# Patient Record
Sex: Male | Born: 1990 | Race: Black or African American | Hispanic: No | Marital: Single | Smoking: Never smoker
Health system: Southern US, Community
[De-identification: ages and names within clinical notes are randomized; demographics above are authoritative.]

## PROBLEM LIST (undated history)

## (undated) DIAGNOSIS — J309 Allergic rhinitis, unspecified: Secondary | ICD-10-CM

## (undated) DIAGNOSIS — G8929 Other chronic pain: Secondary | ICD-10-CM

## (undated) DIAGNOSIS — R519 Headache, unspecified: Secondary | ICD-10-CM

## (undated) DIAGNOSIS — R51 Headache: Secondary | ICD-10-CM

---
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
## 2012-07-13 ENCOUNTER — Encounter (HOSPITAL_COMMUNITY): Payer: Self-pay | Admitting: *Deleted

## 2012-07-13 ENCOUNTER — Emergency Department (HOSPITAL_COMMUNITY)
Admission: EM | Admit: 2012-07-13 | Discharge: 2012-07-14 | Disposition: A | Discharge status: Home / Self Care | Payer: BC Managed Care – PPO | Attending: Emergency Medicine | Admitting: Emergency Medicine

## 2012-07-13 DIAGNOSIS — R112 Nausea with vomiting, unspecified: Secondary | ICD-10-CM | POA: Insufficient documentation

## 2012-07-13 DIAGNOSIS — R42 Dizziness and giddiness: Secondary | ICD-10-CM | POA: Insufficient documentation

## 2012-07-13 DIAGNOSIS — R51 Headache: Secondary | ICD-10-CM | POA: Insufficient documentation

## 2012-07-13 MED ORDER — MORPHINE SULFATE 4 MG/ML IJ SOLN
4.0000 mg | Freq: Once | INTRAMUSCULAR | Status: AC
  Administered 2012-07-14: 4 mg via INTRAVENOUS
  Filled 2012-07-13: qty 1

## 2012-07-13 MED ORDER — PROMETHAZINE HCL 25 MG/ML IJ SOLN
12.5000 mg | Freq: Once | INTRAMUSCULAR | Status: AC
  Administered 2012-07-14: 12.5 mg via INTRAVENOUS
  Filled 2012-07-13: qty 1

## 2012-07-13 MED ORDER — SODIUM CHLORIDE 0.9 % IV SOLN: 1000.0000 mL | INTRAVENOUS | Status: DC

## 2012-07-13 MED ORDER — SODIUM CHLORIDE 0.9 % IV SOLN
1000.0000 mL | Freq: Once | INTRAVENOUS | Status: AC
  Administered 2012-07-14: 1000 mL via INTRAVENOUS

## 2012-07-13 NOTE — ED Notes (Signed)
Pt c/o vomiting x 2 days, dizziness earlier with a headache.

## 2012-07-14 LAB — URINALYSIS, ROUTINE W REFLEX MICROSCOPIC
Bilirubin Urine: NEGATIVE
Glucose, UA: NEGATIVE mg/dL
Hgb urine dipstick: NEGATIVE
Ketones, ur: 15 mg/dL — AB
Leukocytes, UA: NEGATIVE
Nitrite: NEGATIVE
Protein, ur: NEGATIVE mg/dL
Specific Gravity, Urine: 1.025 (ref 1.005–1.030)
Urobilinogen, UA: 0.2 mg/dL (ref 0.0–1.0)
pH: 6 (ref 5.0–8.0)

## 2012-07-14 LAB — BASIC METABOLIC PANEL
CO2: 27 mEq/L (ref 19–32)
Glucose, Bld: 87 mg/dL (ref 70–99)
Potassium: 3.5 mEq/L (ref 3.5–5.1)
Sodium: 136 mEq/L (ref 135–145)

## 2012-07-14 MED ORDER — PROMETHAZINE HCL 12.5 MG PO TABS: 12.5000 mg | ORAL_TABLET | Freq: Four times a day (QID) | ORAL | Status: DC | PRN

## 2012-07-14 MED ORDER — HYDROCODONE-ACETAMINOPHEN 5-325 MG PO TABS: 1.0000 | ORAL_TABLET | ORAL | Status: DC | PRN

## 2012-07-14 NOTE — ED Provider Notes (Signed)
History    CSN: [id number] Arrival date & time 07/13/12  2242  First MD Initiated Contact with Patient 07/13/12 2336     Chief Complaint  Patient presents with  . Emesis  . Headache  . Dizziness   (Consider location/radiation/quality/duration/timing/severity/associated sxs/prior Treatment) Patient is a 22 y.o. male presenting with vomiting and headaches. The history is provided by the patient.  Emesis Severity:  Moderate Duration:  2 days Timing:  Intermittent Quality:  Stomach contents Able to tolerate:  Liquids Progression:  Worsening Chronicity:  New Recent urination:  Normal Relieved by:  Nothing Worsened by:  Nothing tried Ineffective treatments:  None tried Associated symptoms: headaches   Associated symptoms: no abdominal pain, no arthralgias, no fever and no sore throat   Risk factors: no alcohol use, no diabetes and no suspect food intake   Headache Associated symptoms: vomiting   Associated symptoms: no abdominal pain, no back pain, no cough, no dizziness, no neck pain, no photophobia, no seizures and no sore throat    History reviewed. No pertinent past medical history. History reviewed. No pertinent past surgical history. History reviewed. No pertinent family history. History  Substance Use Topics  . Smoking status: Never Smoker   . Smokeless tobacco: Not on file  . Alcohol Use: No    Review of Systems  Constitutional: Negative for activity change.       All ROS Neg except as noted in HPI  HENT: Negative for nosebleeds, sore throat and neck pain.   Eyes: Negative for photophobia and discharge.  Respiratory: Negative for cough, shortness of breath and wheezing.   Cardiovascular: Negative for chest pain and palpitations.  Gastrointestinal: Positive for vomiting. Negative for abdominal pain and blood in stool.  Genitourinary: Negative for dysuria, frequency and hematuria.  Musculoskeletal: Negative for back pain and arthralgias.  Skin: Negative.    Neurological: Positive for headaches. Negative for dizziness, seizures and speech difficulty.  Psychiatric/Behavioral: Negative for hallucinations and confusion.    Allergies  Other and Propylene carbonate  Home Medications  No current outpatient prescriptions on file. BP 147/77  Pulse 63  Temp(Src) 98.6 F (37 C) (Oral)  Resp 18  Ht 5\' 2"  (1.575 m)  Wt 180 lb (81.647 kg)  BMI 32.91 kg/m2 Physical Exam  Nursing note and vitals reviewed. Constitutional: He is oriented to person, place, and time. He appears well-developed and well-nourished.  Non-toxic appearance.  HENT:  Head: Normocephalic.  Right Ear: Tympanic membrane and external ear normal.  Left Ear: Tympanic membrane and external ear normal.  Eyes: EOM and lids are normal. Pupils are equal, round, and reactive to light.  Neck: Normal range of motion. Neck supple. Carotid bruit is not present.  Cardiovascular: Normal rate, regular rhythm, normal heart sounds, intact distal pulses and normal pulses.   Pulmonary/Chest: Breath sounds normal. No respiratory distress.  Abdominal: Soft. Bowel sounds are normal. There is no tenderness. There is no guarding.  Musculoskeletal: Normal range of motion.  Lymphadenopathy:       Head (right side): No submandibular adenopathy present.       Head (left side): No submandibular adenopathy present.    He has no cervical adenopathy.  Neurological: He is alert and oriented to person, place, and time. He has normal strength. No cranial nerve deficit or sensory deficit. He exhibits normal muscle tone. Coordination normal.  Skin: Skin is warm and dry.  Psychiatric: He has a normal mood and affect. His speech is normal.    ED Course  Procedures (including critical care time) Labs Reviewed  URINALYSIS, ROUTINE W REFLEX MICROSCOPIC - Abnormal; Notable for the following:    Ketones, ur 15 (*)    All other components within normal limits  BASIC METABOLIC PANEL   No results found. No  diagnosis found.  MDM  I have reviewed nursing notes, vital signs, and all appropriate lab and imaging results for this patient. Patient presents to the emergency department with 2-3 days of vomiting. Today the patient experienced some dizziness accompanied by headache mostly in the frontal region. Patient did not measure an elevation in temperature.  Vital signs are well within normal limits. Examination is negative for any acute findings or changes.  Patient given IV fluids and IV antibiotic with good results. Recheck of the patient reveals the headache has resolved. There's been no nausea or vomiting since being in the emergency department. The patient feels at this time he can handle the condition at home with anti-medics prescriptions.  Patient advised to return to the emergency department immediately if any changes, problems, or concerns.  Kathie Dike, PA-C 07/14/12 [phone]

## 2012-07-24 NOTE — ED Provider Notes (Signed)
Medical screening examination/treatment/procedure(s) were performed by non-physician practitioner and as supervising physician I was immediately available for consultation/collaboration.  Farha Dano S. Etheline Geppert, MD 07/24/12 0441 

## 2013-02-02 ENCOUNTER — Encounter (HOSPITAL_COMMUNITY): Payer: Self-pay | Admitting: Emergency Medicine

## 2013-02-02 ENCOUNTER — Emergency Department (HOSPITAL_COMMUNITY)
Admission: EM | Admit: 2013-02-02 | Discharge: 2013-02-02 | Disposition: A | Discharge status: Home / Self Care | Payer: BC Managed Care – PPO | Attending: Emergency Medicine | Admitting: Emergency Medicine

## 2013-02-02 ENCOUNTER — Emergency Department (HOSPITAL_COMMUNITY): Payer: BC Managed Care – PPO

## 2013-02-02 DIAGNOSIS — G8929 Other chronic pain: Secondary | ICD-10-CM | POA: Insufficient documentation

## 2013-02-02 DIAGNOSIS — R195 Other fecal abnormalities: Secondary | ICD-10-CM

## 2013-02-02 DIAGNOSIS — J069 Acute upper respiratory infection, unspecified: Secondary | ICD-10-CM | POA: Insufficient documentation

## 2013-02-02 DIAGNOSIS — R112 Nausea with vomiting, unspecified: Secondary | ICD-10-CM | POA: Insufficient documentation

## 2013-02-02 DIAGNOSIS — K921 Melena: Secondary | ICD-10-CM | POA: Insufficient documentation

## 2013-02-02 HISTORY — DX: Headache, unspecified: G89.29

## 2013-02-02 HISTORY — DX: Allergic rhinitis, unspecified: J30.9

## 2013-02-02 HISTORY — DX: Headache: R51

## 2013-02-02 HISTORY — DX: Headache, unspecified: R51.9

## 2013-02-02 LAB — COMPREHENSIVE METABOLIC PANEL
ALBUMIN: 3.8 g/dL (ref 3.5–5.2)
ALT: 57 U/L — AB (ref 0–53)
AST: 44 U/L — AB (ref 0–37)
Alkaline Phosphatase: 71 U/L (ref 39–117)
BILIRUBIN TOTAL: 0.3 mg/dL (ref 0.3–1.2)
BUN: 10 mg/dL (ref 6–23)
CHLORIDE: 99 meq/L (ref 96–112)
CO2: 28 meq/L (ref 19–32)
CREATININE: 1.05 mg/dL (ref 0.50–1.35)
Calcium: 9 mg/dL (ref 8.4–10.5)
GFR calc Af Amer: >90 mL/min (ref >90)
GFR calc non Af Amer: >90 mL/min (ref >90)
Glucose, Bld: 94 mg/dL (ref 70–99)
Potassium: 3.8 mEq/L (ref 3.7–5.3)
SODIUM: 137 meq/L (ref 137–147)
Total Protein: 7.5 g/dL (ref 6.0–8.3)

## 2013-02-02 LAB — CBC WITH DIFFERENTIAL/PLATELET
BASOS ABS: 0 10*3/uL (ref 0.0–0.1)
BASOS PCT: 0 % (ref 0–1)
Eosinophils Absolute: 0.2 10*3/uL (ref 0.0–0.7)
Eosinophils Relative: 5 % (ref 0–5)
HEMATOCRIT: 41.8 % (ref 39.0–52.0)
Hemoglobin: 14.6 g/dL (ref 13.0–17.0)
LYMPHS PCT: 22 % (ref 12–46)
Lymphs Abs: 1.1 10*3/uL (ref 0.7–4.0)
MCH: 30.2 pg (ref 26.0–34.0)
MCHC: 34.9 g/dL (ref 30.0–36.0)
MCV: 86.5 fL (ref 78.0–100.0)
MONO ABS: 0.4 10*3/uL (ref 0.1–1.0)
Monocytes Relative: 9 % (ref 3–12)
NEUTROS ABS: 3 10*3/uL (ref 1.7–7.7)
NEUTROS PCT: 63 % (ref 43–77)
PLATELETS: 164 10*3/uL (ref 150–400)
RBC: 4.83 MIL/uL (ref 4.22–5.81)
RDW: 12.8 % (ref 11.5–15.5)
WBC: 4.7 10*3/uL (ref 4.0–10.5)

## 2013-02-02 LAB — RAPID STREP SCREEN (MED CTR MEBANE ONLY): STREPTOCOCCUS, GROUP A SCREEN (DIRECT): NEGATIVE

## 2013-02-02 LAB — LIPASE, BLOOD: Lipase: 29 U/L (ref 11–59)

## 2013-02-02 MED ORDER — BENZONATATE 100 MG PO CAPS: 100.0000 mg | ORAL_CAPSULE | Freq: Three times a day (TID) | ORAL | Status: DC | PRN

## 2013-02-02 MED ORDER — ONDANSETRON HCL 4 MG PO TABS: 4.0000 mg | ORAL_TABLET | Freq: Three times a day (TID) | ORAL | Status: DC | PRN

## 2013-02-02 MED ORDER — FAMOTIDINE 20 MG PO TABS
40.0000 mg | ORAL_TABLET | Freq: Once | ORAL | Status: AC
  Administered 2013-02-02: 40 mg via ORAL
  Filled 2013-02-02: qty 2

## 2013-02-02 NOTE — ED Notes (Signed)
Pt now reporting lower abd pain and headache rating 4/10 pain.

## 2013-02-02 NOTE — ED Notes (Signed)
Reports chest congestion and cough x 2 days.  Vomiting blood x 2 today.  Denies abd pain/diarrhea.

## 2013-02-02 NOTE — Discharge Instructions (Signed)
°Emergency Department Resource Guide °1) Find a Doctor and Pay Out of Pocket °Although you won't have to find out who is covered by your insurance plan, it is a good idea to ask around and get recommendations. You will then need to call the office and see if the doctor you have chosen will accept you as a new patient and what types of options they offer for patients who are self-pay. Some doctors offer discounts or will set up payment plans for their patients who do not have insurance, but you will need to ask so you aren't surprised when you get to your appointment. ° °2) Contact Your Local Health Department °Not all health departments have doctors that can see patients for sick visits, but many do, so it is worth a call to see if yours does. If you don't know where your local health department is, you can check in your phone book. The CDC also has a tool to help you locate your state's health department, and many state websites also have listings of all of their local health departments. ° °3) Find a Walk-in Clinic °If your illness is not likely to be very severe or complicated, you may want to try a walk in clinic. These are popping up all over the country in pharmacies, drugstores, and shopping centers. They're usually staffed by nurse practitioners or physician assistants that have been trained to treat common illnesses and complaints. They're usually fairly quick and inexpensive. However, if you have serious medical issues or chronic medical problems, these are probably not your best option. ° °No Primary Care Doctor: °- Call [facility] Connect at  832-8000 - they can help you locate a primary care doctor that  accepts your insurance, provides certain services, etc. °- Physician Referral Service- 1-800-533-3463 ° °Chronic Pain Problems: °Organization         Address  Phone   Notes  °[facility]  (336) 297-2271 Patients need to be referred by their primary care doctor.  ° °Medication  Assistance: °Organization         Address  Phone   Notes  °[place] [facility] [address] °[place], [place] [address] (336) 641-8030 --Must be a resident of [place] °-- Must have NO insurance coverage whatsoever (no Medicaid/ Medicare, etc.) °-- The pt. MUST have a primary care doctor that directs their care regularly and follows them in the community °  °[facility]  (866) 331-1348   °[employer]  (888) 892-1162   ° °Agencies that provide inexpensive medical care: °Organization         Address  Phone   Notes  °[facility]  (336) 832-8035   °[facility]    (336) 832-7272   °[facility] [address] °[place], [place] [address] (336) 832-4777   °[facility] [address], °[place] (336) 271-4999   °[facility]    (336) 373-0678   °[facility]    (336) 272-1050   °[facility] ° [address], [place] Phone:  (336) 832-4444, Fax:  (336) 832-4440 Hours of Operation:  9 am - 6 pm, M-F.  Also accepts Medicaid/Medicare and self-pay.  °[facility] ° [address], [place] Phone: (336) 832-3150, Fax: (336) [phone]. Hours of Operation:  8:30 am - 5:30 pm, M-F.  Also accepts Medicaid and self-pay.  °[facility] [place] [address], [place] Phone: (336) 878-6027   °[facility] [address], [place], [place] (336)723-1848, Ext. 123 Mondays & Thursdays: 7-9 AM.  First 15 patients are seen on a first come, first serve basis. °  ° °Medicaid-accepting [place] Providers: ° °Organization         Address  Phone   Notes  °[facility] [address], [place] (336) 641-2100 Also accepts self-pay patients.  °[facility] [address], [place] ° (336) 856-9996   °[facility] [address], [place]  (336) 288-8857   °[facility] [address] [place] [address], [place] (336) 299-7000   °Veita Bland [address], [place]  ° (336) 373-1557 Only accepts [place] Access Medicaid patients after they have their name applied to their card.  ° °Self-Pay (no insurance) in [place]: ° °Organization         Address  Phone   Notes  °Sickle Cell Patients, [facility] [address], [place] (336) 832-1970   °[facility] [address], [place] (336) 832-4400   °[facility] ° [address], [place] (336) 992-4800   °[facility]/Dr. Osei-Bonsu ° [address] [place] [address], [place] or [address], [place] (336) 841-8500 Phone number for both [place] and [place] locations is the same.  °[facility] [address], [place] (336) 299-0000   °[facility] [address] [place] [address], [place] or [address] (336) 852-7530 °(336) 878-2260   °[facility] [address], [place] (336) 350-1642, phone; (336) 294-5005, fax Sees patients 1st and 3rd Saturday of every month.  Must not qualify for public or private insurance (i.e. Medicaid, Medicare, [place] Health Choice, Veterans' Benefits) • Household income should be no more than 200% of the poverty level •The clinic cannot treat you if you are pregnant or think you are pregnant • Sexually transmitted diseases are not treated at the clinic.  ° ° °Dental Care: °Organization         Address  Phone  Notes  °[place] [facility] [address], [place] (336) 641-6152 Accepts children up to age 21 who are enrolled in Medicaid or [place] Health Choice; pregnant women with a Medicaid card; and children who have applied for Medicaid or [place] Health Choice, but were declined, whose parents can pay a reduced fee at time of service.  °[place]  [facility] [place]  [address], [place] (336) 641-7733 Accepts children up to age 21 who are enrolled in Medicaid or [place] Health Choice; pregnant women with a Medicaid card; and children who have applied for Medicaid or [place] Health Choice, but were declined, whose parents can pay a reduced fee at time of service.  °[facility] ° [address], [place] (336) 641-4533 Patients are seen by appointment only. Walk-ins are not accepted. [facility] will see patients 18 years of age and older. °Monday - Tuesday (8am-5pm) °Most Wednesdays (8:30-5pm) °$30 per visit, cash only  °[facility] ° [address], [place] (336) 641-4533 Patients are seen by appointment only. Walk-ins are not accepted. [facility] will see patients 18 years of age and older. °One   Wednesday Evening (Monthly: Volunteer Based).  $30 per visit, cash only  °[facility]  (919) 537-3737 for adults; Children under age 4, call Graduate Pediatric Dentistry at ([phone]) [phone]. Children aged 4-14, please call (919) [phone] to request a pediatric application. ° Dental services are provided in all areas of dental care including fillings, crowns and bridges, complete and partial dentures, implants, gum treatment, root canals, and extractions. Preventive care is also provided. Treatment is provided to both adults and children. °Patients are selected via a lottery and there is often a waiting list. °  °[facility] [address], °[place] ° (336) 763-8833 [email or web address] °  °[facility] [address], [place], [place] (336)723-1848, Ext. 123 Second and Fourth Thursday of each month, opens at 6:30 AM; Clinic ends at 9 AM.  Patients are seen on a first-come first-served basis, and a limited number are seen during each clinic.  ° °[facility] ° [address], [place], [place] (336) 723-7904    Eligibility Requirements °You must have lived in [place], [place], or [place] counties for at least the last three months. °  You cannot be eligible for state or federal sponsored healthcare insurance, including Veterans Administration, Medicaid, or Medicare. °  You generally cannot be eligible for healthcare insurance through your employer.  °  How to apply: °Eligibility screenings are held every Tuesday and Wednesday afternoon from 1:00 pm until 4:00 pm. You do not need an appointment for the interview!  °[facility] [address], [place], [place] 336-631-2330   °[facility]  336-342-8273   °[facility]  336-703-3100   °[facility]  336-570-6415   ° °Behavioral Health Resources in the Community: °Intensive Outpatient Programs °Organization         Address  Phone  Notes  °[facility] [address], [place], [place] 336-878-6098   °[facility] [address], [place], [place] 336-832-9800   °[facility] [address], [place], [place] ° 336-882-2125   °[facility] [address],  °[place], [place] 1-800-853-5163 or 336-641-4981   °Substance Abuse Resources °Organization         Address  Phone  Notes  °[facility]  336-882-2125   °[facility]  336-784-9470   °[facility]  336-285-9073   °[facility]  336-845-3988   °Residential & Outpatient Substance Abuse Program  1-800-659-3381   °Psychological Services °Organization         Address  Phone  Notes  °[facility]  336- 832-9600   °[facility]  336- 378-7881   °[facility] [address], [place] 1-800-853-5163 or 336-641-4981   ° °Mobile Crisis Teams °Organization         Address  Phone  Notes  °[facility], Mobile Crisis Care Unit  1-877-626-1772   °Assertive °[facility] ° [address]  [place], [place] 336-834-9664   °Sharon DeEsch [address] °[place] 336-554-5454   ° °Self-Help/Support Groups °Organization         Address  Phone             Notes  °[facility] of  - variety of support groups  336- 373-1402 Call for more information  °Narcotics Anonymous (NA), [facility] [address], °[place]  2 meetings at this location  ° °  Residential Treatment Programs Organization         Address  Phone  Notes  [facility] [address],    [place]  [phone]-(563)611-9099   [facility]  [address], [place] [id number], [place], [place] [phone]   [facility] [address], [place] [phone]) [phone] Admissions: 8am-3pm M-F  [facility] [address]    [place], [place] [phone]   [facility] [address], [place], [place] [phone]   [facility] [address]  [place], [place] [phone]   [facility] Intensive Outpatient [address], [place], [place] [phone](804) [phone]   [facility] ([facility] [address]  [place], [place] [phone] or [phone])[phone]   [facility] ([facility] [address] [place], [place] [phone] Accepts Medicaid  [facility] [address]  [place] [phone] Substance Abuse/Addiction Treatment   [facility] Resources Organization         Address  Phone  Notes  [facility]  320-853-0053(888) 843-360-3870   Angie FavaJulie Brannon, PhD [address], [address] [place], [place]   ([phone](336) (351) [phone] or [phone](336) ([phone]   [facility]   [address] [place], [place] [phone](336) [phone]   [facility] [address], [place], [place] (570) [phone](336) (303) [phone] Insurance/Medicaid/sponsorship through [facility] [address]                                    [place], [place] ([phone](336) ([phone]) [phone] Therapy/tele-psych/case    [facility] [address]   [place], [place] [phone](336) [phone]    Dr. Lolly MustacheArfeen  [phone](336) [phone]   [facility]  [employer] [facility] 1) [address], [place] 2) [address], [place] 3)  [address], [place] [phone](336) [phone](336) [phone]  [phone](336) [phone]   [facility] Child Abuse Hotline (581)167-8397(336) 918-674-8663 or 240-710-9758(336) [phone] (After Hours)       Take over the counter decongestant (such as sudafed), as directed on packaging, for the next week.  Use over the counter normal saline nasal spray, as instructed in the Emergency Department, several times per day for the next 2 weeks.  Gargle with warm water several times per day to help with discomfort.  May also use over the counter sore throat pain medicines such as chloraseptic or sucrets, as directed on packaging, as needed for discomfort. Eat a bland diet, avoiding greasy, fatty, fried foods, as well as spicy and acidic foods or beverages.  Avoid eating within the hour or 2 before going to bed or laying down.  Also avoid teas, colas, coffee, chocolate, pepermint and spearment. Avoid ibuprofen and aspirin.  Take over the counter pepcid, one tablet by mouth twice a day, for the next 2 to 3 weeks. Take the prescription as directed. Your liver function tests were mildly elevated today. Call your regular medical doctor tomorrow to schedule a follow up appointment in the next 2 days to recheck this finding. Call the GI doctor tomorrow to schedule a follow up appointment within the next week.  Return to the Emergency Department immediately if worsening.

## 2013-02-02 NOTE — ED Provider Notes (Signed)
CSN: [id number]     Arrival date & time 02/02/13  1640 History   First MD Initiated Contact with Patient 02/02/13 1715     Chief Complaint  Patient presents with  . Hematemesis  . Nasal Congestion  . Sore Throat  . Cough    HPI Pt was seen at 1740.  Per pt, c/o gradual onset and persistence of constant sore throat, runny/stuffy nose, sinus congestion, and cough for the past 2-3 days.  States he coughs until he vomits. States he noticed "red streaks" in his emesis for the past 2 days. Denies N/V without coughing and has been able to tol PO well.  Denies seeing blood in sputum. Denies fevers, no rash, no CP/SOB, no diarrhea, no abd pain, no black or blood in stools.     Past Medical History  Diagnosis Date  . Chronic headache   . Allergic rhinitis    History reviewed. No pertinent past surgical history.  History  Substance Use Topics  . Smoking status: Never Smoker   . Smokeless tobacco: Not on file  . Alcohol Use: No    Review of Systems ROS: Statement: All systems negative except as marked or noted in the HPI; Constitutional: Negative for fever and chills. ; ; Eyes: Negative for eye pain, redness and discharge. ; ; ENMT: Negative for ear pain, hoarseness, +rhinorrhea, nasal congestion, sinus pressure and sore throat. ; ; Cardiovascular: Negative for chest pain, palpitations, diaphoresis, dyspnea and peripheral edema. ; ; Respiratory: +cough. Negative for wheezing and stridor. ; ; Gastrointestinal: +post tussive emesis with red streaks in emesis. Negative for diarrhea, abdominal pain, blood in stool, jaundice and rectal bleeding. . ; ; Genitourinary: Negative for dysuria, flank pain and hematuria. ; ; Musculoskeletal: Negative for back pain and neck pain. Negative for swelling and trauma.; ; Skin: Negative for pruritus, rash, abrasions, blisters, bruising and skin lesion.; ; Neuro: Negative for headache, lightheadedness and neck stiffness. Negative for weakness, altered level of  consciousness , altered mental status, extremity weakness, paresthesias, involuntary movement, seizure and syncope.     Allergies  Other and Propylene carbonate  Home Medications   Current Outpatient Rx  Name  Route  Sig  Dispense  Refill  . pseudoephedrine (SUDAFED) 60 MG tablet   Oral   Take 120 mg by mouth every 4 (four) hours as needed for congestion.          BP 156/84  Pulse 83  Temp(Src) 98.3 F (36.8 C) (Oral)  Resp 16  Ht 5\' 2"  (1.575 m)  Wt 186 lb 7 oz (84.567 kg)  BMI 34.09 kg/m2  SpO2 99% Physical Exam 1745: Physical examination:  Nursing notes reviewed; Vital signs and O2 SAT reviewed;  Constitutional: Well developed, Well nourished, Well hydrated, In no acute distress; Head:  Normocephalic, atraumatic; Eyes: EOMI, PERRL, No scleral icterus; ENMT: TM's clear bilat. +edemetous nasal turbinates bilat with clear rhinorrhea. Mouth and pharynx without lesions. No tonsillar exudates. No intra-oral edema. No submandibular or sublingual edema. No hoarse voice, no drooling, no stridor. No pain with manipulation of larynx. Mouth and pharynx normal, Mucous membranes moist; Neck: Supple, Full range of motion, No lymphadenopathy; Cardiovascular: Regular rate and rhythm, No murmur, rub, or gallop; Respiratory: Breath sounds clear & equal bilaterally, No rales, rhonchi, wheezes.  Speaking full sentences with ease, Normal respiratory effort/excursion; Chest: Nontender, Movement normal; Abdomen: Soft, Nontender, Nondistended, Normal bowel sounds. Rectal exam performed w/permission of pt and ED RN chaperone present.  Anal tone normal.  Non-tender,  soft brown stool in rectal vault, heme positive.  No fissures, no external hemorrhoids, no palp masses.; Genitourinary: No CVA tenderness; Extremities: Pulses normal, No tenderness, No edema, No calf edema or asymmetry.; Neuro: AA&Ox3, Major CN grossly intact.  Speech clear. No gross focal motor or sensory deficits in extremities. Climbs on and off  stretcher easily by himself. Gait steady.; Skin: Color normal, Warm, Dry.   ED Course  Procedures    EKG Interpretation   None       MDM  MDM Reviewed: previous chart, nursing note and vitals Reviewed previous: labs Interpretation: labs and x-ray   Results for orders placed during the hospital encounter of 02/02/13  RAPID STREP SCREEN      Result Value Range   Streptococcus, Group A Screen (Direct) NEGATIVE  NEGATIVE  CBC WITH DIFFERENTIAL      Result Value Range   WBC 4.7  4.0 - 10.5 K/uL   RBC 4.83  4.22 - 5.81 MIL/uL   Hemoglobin 14.6  13.0 - 17.0 g/dL   HCT 16.1  09.6 - 04.5 %   MCV 86.5  78.0 - 100.0 fL   MCH 30.2  26.0 - 34.0 pg   MCHC 34.9  30.0 - 36.0 g/dL   RDW 40.9  81.1 - 91.4 %   Platelets 164  150 - 400 K/uL   Neutrophils Relative % 63  43 - 77 %   Neutro Abs 3.0  1.7 - 7.7 K/uL   Lymphocytes Relative 22  12 - 46 %   Lymphs Abs 1.1  0.7 - 4.0 K/uL   Monocytes Relative 9  3 - 12 %   Monocytes Absolute 0.4  0.1 - 1.0 K/uL   Eosinophils Relative 5  0 - 5 %   Eosinophils Absolute 0.2  0.0 - 0.7 K/uL   Basophils Relative 0  0 - 1 %   Basophils Absolute 0.0  0.0 - 0.1 K/uL  COMPREHENSIVE METABOLIC PANEL      Result Value Range   Sodium 137  137 - 147 mEq/L   Potassium 3.8  3.7 - 5.3 mEq/L   Chloride 99  96 - 112 mEq/L   CO2 28  19 - 32 mEq/L   Glucose, Bld 94  70 - 99 mg/dL   BUN 10  6 - 23 mg/dL   Creatinine, Ser 7.82  0.50 - 1.35 mg/dL   Calcium 9.0  8.4 - 95.6 mg/dL   Total Protein 7.5  6.0 - 8.3 g/dL   Albumin 3.8  3.5 - 5.2 g/dL   AST 44 (*) 0 - 37 U/L   ALT 57 (*) 0 - 53 U/L   Alkaline Phosphatase 71  39 - 117 U/L   Total Bilirubin 0.3  0.3 - 1.2 mg/dL   GFR calc non Af Amer >90  >90 mL/min   GFR calc Af Amer >90  >90 mL/min  LIPASE, BLOOD      Result Value Range   Lipase 29  11 - 59 U/L   Dg Abd Acute W/chest 02/02/2013   CLINICAL DATA:  Hematemesis.  Cough.  EXAM: ACUTE ABDOMEN SERIES (ABDOMEN 2 VIEW & CHEST 1 VIEW)  COMPARISON:  PA and  lateral chest 07/15/2012.  FINDINGS: Single view of the chest demonstrates clear lungs and normal heart size. No pneumothorax or pleural effusion.  Two views of the abdomen show no free intraperitoneal air. The bowel gas pattern is normal. No abnormal abdominal calcification or focal bony abnormality is identified.  IMPRESSION:  Negative exam.   Electronically Signed   By: Drusilla Kannerhomas  Dalessio M.D.   On: 02/02/2013 18:07    1900:  Pt has tol PO well while in the ED without N/V. No stooling while in the ED. Abd remains benign, VSS. Stool heme positive but H/H stable, will refer to GI. Wants to go home now. Dx and testing d/w pt and family.  Questions answered.  Verb understanding, agreeable to d/c home with outpt f/u.     Laray AngerKathleen M Sherol Sabas, DO 02/04/13 [phone]

## 2013-02-05 LAB — CULTURE, GROUP A STREP

## 2013-11-20 ENCOUNTER — Encounter (HOSPITAL_COMMUNITY): Payer: Self-pay | Admitting: Family Medicine

## 2013-11-20 ENCOUNTER — Emergency Department (HOSPITAL_COMMUNITY)
Admission: EM | Admit: 2013-11-20 | Discharge: 2013-11-20 | Disposition: A | Discharge status: Home / Self Care | Payer: BC Managed Care – PPO | Source: Home / Self Care | Attending: Family Medicine | Admitting: Family Medicine

## 2013-11-20 DIAGNOSIS — J3089 Other allergic rhinitis: Secondary | ICD-10-CM

## 2013-11-20 DIAGNOSIS — J0110 Acute frontal sinusitis, unspecified: Secondary | ICD-10-CM

## 2013-11-20 DIAGNOSIS — R42 Dizziness and giddiness: Secondary | ICD-10-CM

## 2013-11-20 MED ORDER — AMOXICILLIN-POT CLAVULANATE 875-125 MG PO TABS: 1.0000 | ORAL_TABLET | Freq: Two times a day (BID) | ORAL | Status: DC

## 2013-11-20 MED ORDER — FLUTICASONE PROPIONATE 50 MCG/ACT NA SUSP: 2.0000 | Freq: Every day | NASAL | Status: DC

## 2013-11-20 MED ORDER — MECLIZINE HCL 50 MG PO TABS: 50.0000 mg | ORAL_TABLET | Freq: Two times a day (BID) | ORAL | Status: DC | PRN

## 2013-11-20 MED ORDER — IPRATROPIUM BROMIDE 0.06 % NA SOLN: 2.0000 | Freq: Four times a day (QID) | NASAL | Status: DC

## 2013-11-20 NOTE — ED Notes (Signed)
Pt states that he has been having sinus pain and productive cough pt is in not in any distress at this time.

## 2013-11-20 NOTE — Discharge Instructions (Signed)
You may have the beginnings of a bacterial sinusitis. This may need antibiotics to clear. If you try the other therapies below without success or develop worsening headaches and fevers then start the antibiotics Please start atrovent during the day and flonase before bed.  Also consider using the antivert for the dizziness. Make sure you stay well hydrated and get plenty of rest today Please also use 400-600mg  of ibuprofen every 4-6 hours as needed for pain, inflammation, and fevers.

## 2013-11-20 NOTE — ED Provider Notes (Addendum)
CSN: [id number]     Arrival date & time 11/20/13  0957 History   First MD Initiated Contact with Patient 11/20/13 1005     Chief Complaint  Patient presents with  . Cough  . Facial Pain   (Consider location/radiation/quality/duration/timing/severity/associated sxs/prior Treatment) HPI  Cough and congestion started around 10 days ago. Associated w/ scratchy throat and runny nose. Tried Afrin, mucinex, robitussin w/ mild benefit. Cough is productive. Today developed frontal HA and dizziness. Deneis fevers.     Past Medical History  Diagnosis Date  . Chronic headache   . Allergic rhinitis    History reviewed. No pertinent past surgical history. No family history on file. History  Substance Use Topics  . Smoking status: Never Smoker   . Smokeless tobacco: Not on file  . Alcohol Use: No    Review of Systems Per HPI with all other pertinent systems negative.   Allergies  Other and Propylene carbonate  Home Medications   Prior to Admission medications   Medication Sig Start Date End Date Taking? Authorizing Provider  amoxicillin-clavulanate (AUGMENTIN) 875-125 MG per tablet Take 1 tablet by mouth 2 (two) times daily. 11/20/13   Ozella Rocksavid J Merrell, MD  benzonatate (TESSALON) 100 MG capsule Take 1 capsule (100 mg total) by mouth 3 (three) times daily as needed for cough. 02/02/13   Samuel JesterKathleen McManus, DO  fluticasone (FLONASE) 50 MCG/ACT nasal spray Place 2 sprays into both nostrils at bedtime. 11/20/13   Ozella Rocksavid J Merrell, MD  ipratropium (ATROVENT) 0.06 % nasal spray Place 2 sprays into both nostrils 4 (four) times daily. 11/20/13   Ozella Rocksavid J Merrell, MD  meclizine (ANTIVERT) 50 MG tablet Take 1 tablet (50 mg total) by mouth 2 (two) times daily as needed for dizziness. 11/20/13   Ozella Rocksavid J Merrell, MD  ondansetron (ZOFRAN) 4 MG tablet Take 1 tablet (4 mg total) by mouth every 8 (eight) hours as needed for nausea or vomiting. 02/02/13   Samuel JesterKathleen McManus, DO  pseudoephedrine (SUDAFED) 60 MG  tablet Take 120 mg by mouth every 4 (four) hours as needed for congestion.    Historical Provider, MD   BP 140/70 mmHg  Pulse 76  Temp(Src) 98.6 F (37 C) (Oral)  Resp 12  SpO2 97% Physical Exam  Constitutional: He is oriented to person, place, and time. He appears well-developed and well-nourished.  HENT:  Head: Normocephalic.  Nasal and frontal sinus congestion Tonsils 1+ w/o exudate  Eyes: EOM are normal. Pupils are equal, round, and reactive to light.  Neck: Normal range of motion.  Cardiovascular: Normal rate.   No murmur heard. Pulmonary/Chest: Effort normal and breath sounds normal.  Abdominal: Soft.  Musculoskeletal: Normal range of motion. He exhibits no tenderness.  Lymphadenopathy:    He has no cervical adenopathy.  Neurological: He is alert and oriented to person, place, and time.  Skin: Skin is warm.  Psychiatric: He has a normal mood and affect. His behavior is normal. Judgment and thought content normal.    ED Course  Procedures (including critical care time) Labs Review Labs Reviewed - No data to display  Imaging Review No results found.   MDM   1. Other allergic rhinitis   2. Acute frontal sinusitis, recurrence not specified   3. Dizziness    Prolonged URI symptoms w/ new onset HA and dizziness. Concerning for bacterial superinfection after viral URI. Will try motrin, flonase, nasal atrovent, and antivert.  If not improving or develops worsening sinus symtpoms and fevers, will start AUgmentin Precautions  given and all questions answered  Shelly Flattenavid Merrell, MD Family Medicine 11/20/2013, 10:38 AM      Ozella Rocksavid J Merrell, MD 11/20/13 1038    Ozella Rocksavid J Merrell, MD 11/20/13 1040

## 2015-08-12 ENCOUNTER — Other Ambulatory Visit: Payer: Self-pay | Admitting: Family Medicine

## 2015-08-12 ENCOUNTER — Emergency Department (HOSPITAL_COMMUNITY): Payer: BLUE CROSS/BLUE SHIELD | Admitting: Anesthesiology

## 2015-08-12 ENCOUNTER — Encounter (HOSPITAL_COMMUNITY): Payer: Self-pay

## 2015-08-12 ENCOUNTER — Encounter (HOSPITAL_COMMUNITY)
Admission: EM | Disposition: A | Discharge status: Home / Self Care | Payer: Self-pay | Source: Home / Self Care | Attending: Emergency Medicine

## 2015-08-12 ENCOUNTER — Observation Stay (HOSPITAL_COMMUNITY)
Admission: EM | Admit: 2015-08-12 | Discharge: 2015-08-14 | Disposition: A | Discharge status: Home / Self Care | Payer: BLUE CROSS/BLUE SHIELD | Attending: General Surgery | Admitting: General Surgery

## 2015-08-12 ENCOUNTER — Ambulatory Visit
Admission: RE | Admit: 2015-08-12 | Discharge: 2015-08-12 | Disposition: A | Discharge status: Home / Self Care | Payer: PRIVATE HEALTH INSURANCE | Source: Ambulatory Visit | Attending: Family Medicine | Admitting: Family Medicine

## 2015-08-12 DIAGNOSIS — K3580 Unspecified acute appendicitis: Secondary | ICD-10-CM

## 2015-08-12 DIAGNOSIS — K353 Acute appendicitis with localized peritonitis: Secondary | ICD-10-CM | POA: Diagnosis not present

## 2015-08-12 DIAGNOSIS — J309 Allergic rhinitis, unspecified: Secondary | ICD-10-CM | POA: Insufficient documentation

## 2015-08-12 DIAGNOSIS — Z7951 Long term (current) use of inhaled steroids: Secondary | ICD-10-CM | POA: Insufficient documentation

## 2015-08-12 DIAGNOSIS — R109 Unspecified abdominal pain: Secondary | ICD-10-CM

## 2015-08-12 DIAGNOSIS — Z9049 Acquired absence of other specified parts of digestive tract: Secondary | ICD-10-CM

## 2015-08-12 DIAGNOSIS — R1031 Right lower quadrant pain: Secondary | ICD-10-CM | POA: Diagnosis present

## 2015-08-12 HISTORY — PX: LAPAROSCOPIC APPENDECTOMY: SHX408

## 2015-08-12 LAB — COMPREHENSIVE METABOLIC PANEL
ALBUMIN: 4.3 g/dL (ref 3.5–5.0)
ALK PHOS: 64 U/L (ref 38–126)
ALT: 14 U/L — ABNORMAL LOW (ref 17–63)
ANION GAP: 11 (ref 5–15)
AST: 18 U/L (ref 15–41)
BUN: 15 mg/dL (ref 6–20)
CHLORIDE: 97 mmol/L — AB (ref 101–111)
CO2: 25 mmol/L (ref 22–32)
Calcium: 9.5 mg/dL (ref 8.9–10.3)
Creatinine, Ser: 1.29 mg/dL — ABNORMAL HIGH (ref 0.61–1.24)
GFR calc non Af Amer: >60 mL/min (ref >60)
GLUCOSE: 76 mg/dL (ref 65–99)
POTASSIUM: 4 mmol/L (ref 3.5–5.1)
SODIUM: 133 mmol/L — AB (ref 135–145)
Total Bilirubin: 0.8 mg/dL (ref 0.3–1.2)
Total Protein: 7.3 g/dL (ref 6.5–8.1)

## 2015-08-12 LAB — CBC
HEMATOCRIT: 45.5 % (ref 39.0–52.0)
HEMOGLOBIN: 15.7 g/dL (ref 13.0–17.0)
MCH: 29.5 pg (ref 26.0–34.0)
MCHC: 34.5 g/dL (ref 30.0–36.0)
MCV: 85.5 fL (ref 78.0–100.0)
Platelets: 193 10*3/uL (ref 150–400)
RBC: 5.32 MIL/uL (ref 4.22–5.81)
RDW: 12.9 % (ref 11.5–15.5)
WBC: 6.6 10*3/uL (ref 4.0–10.5)

## 2015-08-12 LAB — URINALYSIS, ROUTINE W REFLEX MICROSCOPIC
Bilirubin Urine: NEGATIVE
Glucose, UA: NEGATIVE mg/dL
HGB URINE DIPSTICK: NEGATIVE
Ketones, ur: 40 mg/dL — AB
Leukocytes, UA: NEGATIVE
Nitrite: NEGATIVE
PH: 6 (ref 5.0–8.0)
PROTEIN: NEGATIVE mg/dL

## 2015-08-12 LAB — LIPASE, BLOOD: LIPASE: 19 U/L (ref 11–51)

## 2015-08-12 SURGERY — APPENDECTOMY, LAPAROSCOPIC
Anesthesia: General | Site: Abdomen

## 2015-08-12 MED ORDER — METRONIDAZOLE IN NACL 5-0.79 MG/ML-% IV SOLN
500.0000 mg | Freq: Once | INTRAVENOUS | Status: DC
  Filled 2015-08-12: qty 100

## 2015-08-12 MED ORDER — BUPIVACAINE-EPINEPHRINE (PF) 0.25% -1:200000 IJ SOLN
INTRAMUSCULAR | Status: AC
  Filled 2015-08-12: qty 30

## 2015-08-12 MED ORDER — BUPIVACAINE-EPINEPHRINE 0.25% -1:200000 IJ SOLN
INTRAMUSCULAR | Status: DC | PRN
  Administered 2015-08-12: 14 mL

## 2015-08-12 MED ORDER — GLYCOPYRROLATE 0.2 MG/ML IJ SOLN
INTRAMUSCULAR | Status: DC | PRN
  Administered 2015-08-12: 0.4 mg via INTRAVENOUS

## 2015-08-12 MED ORDER — SODIUM CHLORIDE 0.9 % IR SOLN
Status: DC | PRN
  Administered 2015-08-12: 1000 mL

## 2015-08-12 MED ORDER — LIDOCAINE HCL (CARDIAC) 20 MG/ML IV SOLN
INTRAVENOUS | Status: DC | PRN
  Administered 2015-08-12: 80 mg via INTRAVENOUS

## 2015-08-12 MED ORDER — HYDROMORPHONE HCL 1 MG/ML IJ SOLN
0.5000 mg | INTRAMUSCULAR | Status: DC | PRN
  Administered 2015-08-12: 0.5 mg via INTRAVENOUS

## 2015-08-12 MED ORDER — HYDROMORPHONE HCL 1 MG/ML IJ SOLN
INTRAMUSCULAR | Status: AC
  Filled 2015-08-12: qty 1

## 2015-08-12 MED ORDER — 0.9 % SODIUM CHLORIDE (POUR BTL) OPTIME
TOPICAL | Status: DC | PRN
  Administered 2015-08-12: 1000 mL

## 2015-08-12 MED ORDER — IOPAMIDOL (ISOVUE-300) INJECTION 61%
100.0000 mL | Freq: Once | INTRAVENOUS | Status: AC | PRN
  Administered 2015-08-12: 100 mL via INTRAVENOUS

## 2015-08-12 MED ORDER — ROCURONIUM BROMIDE 100 MG/10ML IV SOLN
INTRAVENOUS | Status: DC | PRN
  Administered 2015-08-12: 20 mg via INTRAVENOUS

## 2015-08-12 MED ORDER — SUCCINYLCHOLINE CHLORIDE 20 MG/ML IJ SOLN
INTRAMUSCULAR | Status: DC | PRN
  Administered 2015-08-12: 80 mg via INTRAVENOUS

## 2015-08-12 MED ORDER — PROPOFOL 10 MG/ML IV BOLUS
INTRAVENOUS | Status: AC
  Filled 2015-08-12: qty 20

## 2015-08-12 MED ORDER — ONDANSETRON HCL 4 MG/2ML IJ SOLN: 4.0000 mg | Freq: Once | INTRAMUSCULAR | Status: DC | PRN

## 2015-08-12 MED ORDER — FENTANYL CITRATE (PF) 250 MCG/5ML IJ SOLN
INTRAMUSCULAR | Status: AC
  Filled 2015-08-12: qty 5

## 2015-08-12 MED ORDER — ONDANSETRON HCL 4 MG/2ML IJ SOLN
4.0000 mg | Freq: Once | INTRAMUSCULAR | Status: AC
  Administered 2015-08-12: 4 mg via INTRAVENOUS
  Filled 2015-08-12: qty 2

## 2015-08-12 MED ORDER — NEOSTIGMINE METHYLSULFATE 10 MG/10ML IV SOLN
INTRAVENOUS | Status: DC | PRN
  Administered 2015-08-12: 3 mg via INTRAVENOUS

## 2015-08-12 MED ORDER — ONDANSETRON HCL 4 MG/2ML IJ SOLN
INTRAMUSCULAR | Status: DC | PRN
  Administered 2015-08-12: 4 mg via INTRAVENOUS

## 2015-08-12 MED ORDER — ONDANSETRON HCL 4 MG/2ML IJ SOLN
INTRAMUSCULAR | Status: AC
  Filled 2015-08-12: qty 2

## 2015-08-12 MED ORDER — DEXTROSE 5 % IV SOLN
2.0000 g | Freq: Once | INTRAVENOUS | Status: AC
  Administered 2015-08-12: 2 g via INTRAVENOUS
  Filled 2015-08-12: qty 2

## 2015-08-12 MED ORDER — LIDOCAINE 2% (20 MG/ML) 5 ML SYRINGE
INTRAMUSCULAR | Status: AC
  Filled 2015-08-12: qty 10

## 2015-08-12 MED ORDER — KCL IN DEXTROSE-NACL 20-5-0.45 MEQ/L-%-% IV SOLN
INTRAVENOUS | Status: DC
  Administered 2015-08-13 (×2): via INTRAVENOUS
  Filled 2015-08-12 (×3): qty 1000

## 2015-08-12 MED ORDER — FENTANYL CITRATE (PF) 100 MCG/2ML IJ SOLN
INTRAMUSCULAR | Status: DC | PRN
  Administered 2015-08-12: 100 ug via INTRAVENOUS

## 2015-08-12 MED ORDER — ROCURONIUM BROMIDE 10 MG/ML (PF) SYRINGE
PREFILLED_SYRINGE | INTRAVENOUS | Status: AC
  Filled 2015-08-12: qty 10

## 2015-08-12 MED ORDER — MORPHINE SULFATE (PF) 4 MG/ML IV SOLN
4.0000 mg | Freq: Once | INTRAVENOUS | Status: AC
  Administered 2015-08-12: 4 mg via INTRAVENOUS
  Filled 2015-08-12: qty 1

## 2015-08-12 MED ORDER — MIDAZOLAM HCL 2 MG/2ML IJ SOLN
INTRAMUSCULAR | Status: AC
  Filled 2015-08-12: qty 2

## 2015-08-12 MED ORDER — PROPOFOL 10 MG/ML IV BOLUS
INTRAVENOUS | Status: DC | PRN
  Administered 2015-08-12: 200 mg via INTRAVENOUS

## 2015-08-12 MED ORDER — EPHEDRINE SULFATE 50 MG/ML IJ SOLN
INTRAMUSCULAR | Status: DC | PRN
  Administered 2015-08-12: 10 mg via INTRAVENOUS

## 2015-08-12 MED ORDER — LACTATED RINGERS IV SOLN
INTRAVENOUS | Status: DC | PRN
  Administered 2015-08-12 (×2): via INTRAVENOUS

## 2015-08-12 SURGICAL SUPPLY — 40 items
APPLIER CLIP ROT 10 11.4 M/L (STAPLE)
BLADE SURG ROTATE 9660 (MISCELLANEOUS) IMPLANT
CANISTER SUCTION 2500CC (MISCELLANEOUS) ×3 IMPLANT
CHLORAPREP W/TINT 26ML (MISCELLANEOUS) ×3 IMPLANT
CLIP APPLIE ROT 10 11.4 M/L (STAPLE) IMPLANT
COVER SURGICAL LIGHT HANDLE (MISCELLANEOUS) ×3 IMPLANT
CUTTER FLEX LINEAR 45M (STAPLE) ×6 IMPLANT
ELECT REM PT RETURN 9FT ADLT (ELECTROSURGICAL) ×3
ELECTRODE REM PT RTRN 9FT ADLT (ELECTROSURGICAL) ×1 IMPLANT
GLOVE BIO SURGEON STRL SZ8 (GLOVE) ×3 IMPLANT
GLOVE BIOGEL PI IND STRL 7.0 (GLOVE) ×1 IMPLANT
GLOVE BIOGEL PI IND STRL 8 (GLOVE) ×1 IMPLANT
GLOVE BIOGEL PI INDICATOR 7.0 (GLOVE) ×2
GLOVE BIOGEL PI INDICATOR 8 (GLOVE) ×2
GOWN STRL REUS W/ TWL LRG LVL3 (GOWN DISPOSABLE) ×2 IMPLANT
GOWN STRL REUS W/ TWL XL LVL3 (GOWN DISPOSABLE) ×1 IMPLANT
GOWN STRL REUS W/TWL LRG LVL3 (GOWN DISPOSABLE) ×4
GOWN STRL REUS W/TWL XL LVL3 (GOWN DISPOSABLE) ×2
KIT BASIN OR (CUSTOM PROCEDURE TRAY) ×3 IMPLANT
KIT ROOM TURNOVER OR (KITS) ×3 IMPLANT
LIQUID BAND (GAUZE/BANDAGES/DRESSINGS) ×6 IMPLANT
NEEDLE 22X1 1/2 (OR ONLY) (NEEDLE) ×3 IMPLANT
NS IRRIG 1000ML POUR BTL (IV SOLUTION) ×3 IMPLANT
PAD ARMBOARD 7.5X6 YLW CONV (MISCELLANEOUS) ×6 IMPLANT
POUCH SPECIMEN RETRIEVAL 10MM (ENDOMECHANICALS) ×3 IMPLANT
RELOAD 45 VASCULAR/THIN (ENDOMECHANICALS) ×3 IMPLANT
RELOAD STAPLE TA45 3.5 REG BLU (ENDOMECHANICALS) IMPLANT
SCALPEL HARMONIC ACE (MISCELLANEOUS) ×3 IMPLANT
SCISSORS LAP 5X35 DISP (ENDOMECHANICALS) IMPLANT
SET IRRIG TUBING LAPAROSCOPIC (IRRIGATION / IRRIGATOR) ×3 IMPLANT
SPECIMEN JAR SMALL (MISCELLANEOUS) ×3 IMPLANT
SUT VIC AB 4-0 PS2 27 (SUTURE) ×6 IMPLANT
TOWEL OR 17X24 6PK STRL BLUE (TOWEL DISPOSABLE) ×3 IMPLANT
TOWEL OR 17X26 10 PK STRL BLUE (TOWEL DISPOSABLE) ×3 IMPLANT
TRAY FOLEY CATH 16FR SILVER (SET/KITS/TRAYS/PACK) ×3 IMPLANT
TRAY LAPAROSCOPIC MC (CUSTOM PROCEDURE TRAY) ×3 IMPLANT
TROCAR XCEL 12X100 BLDLESS (ENDOMECHANICALS) ×3 IMPLANT
TROCAR XCEL BLUNT TIP 100MML (ENDOMECHANICALS) ×3 IMPLANT
TROCAR XCEL NON-BLD 5MMX100MML (ENDOMECHANICALS) ×3 IMPLANT
TUBING INSUFFLATION (TUBING) ×3 IMPLANT

## 2015-08-12 NOTE — ED Provider Notes (Signed)
[facility]-EMERGENCY DEPT Provider Note   CSN: [id number] Arrival date & time: 08/12/15  1637  First Provider Contact:  First MD Initiated Contact with Patient 08/12/15 2035        History   Chief Complaint Chief Complaint  Patient presents with  . Abdominal Pain    HPI Ray Foley is a 25 y.o. male.  Patient sent by PCP for appendicitis. Awoke overnight with right-sided lower abdominal pain is persistently worsening. Associated with nausea. No vomiting. No fever. No dysuria or hematuria. No testicular pain. No bowel movement for 2 or 3 days. Had outpatient CT scan which showed early appendicitis. Denies any other medical problems. Denies any previous surgeries. Last by mouth intake was around 9 AM.   The history is provided by the patient.  Abdominal Pain   Associated symptoms include nausea. Pertinent negatives include fever, vomiting, dysuria, hematuria, arthralgias and myalgias.    Past Medical History:  Diagnosis Date  . Allergic rhinitis   . Chronic headache     Patient Active Problem List   Diagnosis Date Noted  . ALLERGIC RHINITIS, SEASONAL 05/11/2008  . SKIN RASH 05/11/2008  . HEADACHE 05/11/2008    History reviewed. No pertinent surgical history.     Home Medications    Prior to Admission medications   Medication Sig Start Date End Date Taking? Authorizing Provider  amoxicillin-clavulanate (AUGMENTIN) 875-125 MG per tablet Take 1 tablet by mouth 2 (two) times daily. 11/20/13   Ozella Rocks, MD  benzonatate (TESSALON) 100 MG capsule Take 1 capsule (100 mg total) by mouth 3 (three) times daily as needed for cough. 02/02/13   Samuel Jester, DO  fluticasone (FLONASE) 50 MCG/ACT nasal spray Place 2 sprays into both nostrils at bedtime. 11/20/13   Ozella Rocks, MD  ipratropium (ATROVENT) 0.06 % nasal spray Place 2 sprays into both nostrils 4 (four) times daily. 11/20/13   Ozella Rocks, MD  meclizine (ANTIVERT) 50 MG tablet Take 1 tablet (50 mg total) by  mouth 2 (two) times daily as needed for dizziness. 11/20/13   Ozella Rocks, MD  ondansetron (ZOFRAN) 4 MG tablet Take 1 tablet (4 mg total) by mouth every 8 (eight) hours as needed for nausea or vomiting. 02/02/13   Samuel Jester, DO  pseudoephedrine (SUDAFED) 60 MG tablet Take 120 mg by mouth every 4 (four) hours as needed for congestion.    Historical Provider, MD    Family History No family history on file.  Social History Social History  Substance Use Topics  . Smoking status: Never Smoker  . Smokeless tobacco: Never Used  . Alcohol use No     Allergies   Other and Propylene carbonate   Review of Systems Review of Systems  Constitutional: Positive for activity change, appetite change and fatigue. Negative for fever.  HENT: Negative for congestion.   Respiratory: Negative for cough, chest tightness and shortness of breath.   Cardiovascular: Negative for chest pain.  Gastrointestinal: Positive for abdominal pain and nausea. Negative for vomiting.  Genitourinary: Negative for dysuria, hematuria, testicular pain and urgency.  Musculoskeletal: Negative for arthralgias and myalgias.  Skin: Negative for wound.  Neurological: Negative for dizziness, weakness and light-headedness.  Hematological: Negative for adenopathy.   A complete 10 system review of systems was obtained and all systems are negative except as noted in the HPI and PMH.    Physical Exam Updated Vital Signs BP 137/84 (BP Location: Left Arm)   Pulse 79   Temp 98.2 F (36.8  C) (Oral)   Resp 16   Ht 5\' 2"  (1.575 m)   Wt 190 lb (86.2 kg)   SpO2 99%   BMI 34.75 kg/m   Physical Exam  Constitutional: He is oriented to person, place, and time. He appears well-developed and well-nourished. No distress.  HENT:  Head: Normocephalic and atraumatic.  Mouth/Throat: Oropharynx is clear and moist. No oropharyngeal exudate.  Eyes: Conjunctivae and EOM are normal. Pupils are equal, round, and reactive to light.    Neck: Normal range of motion. Neck supple.  No meningismus.  Cardiovascular: Normal rate, regular rhythm, normal heart sounds and intact distal pulses.   No murmur heard. Pulmonary/Chest: Effort normal and breath sounds normal. No respiratory distress.  Abdominal: Soft. There is tenderness. There is guarding. There is no rebound.  TTP RLQ with guarding  Genitourinary:  Genitourinary Comments: No testicular pain  Musculoskeletal: Normal range of motion. He exhibits no edema or tenderness.  Neurological: He is alert and oriented to person, place, and time. No cranial nerve deficit. He exhibits normal muscle tone. Coordination normal.  No ataxia on finger to nose bilaterally. No pronator drift. 5/5 strength throughout. CN 2-12 intact.Equal grip strength. Sensation intact.   Skin: Skin is warm.  Psychiatric: He has a normal mood and affect. His behavior is normal.  Nursing note and vitals reviewed.    ED Treatments / Results  Labs (all labs ordered are listed, but only abnormal results are displayed) Labs Reviewed  COMPREHENSIVE METABOLIC PANEL - Abnormal; Notable for the following:       Result Value   Sodium 133 (*)    Chloride 97 (*)    Creatinine, Ser 1.29 (*)    ALT 14 (*)    All other components within normal limits  URINALYSIS, ROUTINE W REFLEX MICROSCOPIC (NOT AT [facility]) - Abnormal; Notable for the following:    Specific Gravity, Urine >1.046 (*)    Ketones, ur 40 (*)    All other components within normal limits  LIPASE, BLOOD  CBC    EKG  EKG Interpretation None       Radiology Ct Abdomen Pelvis W Contrast  Result Date: 08/12/2015 CLINICAL DATA:  Acute right lower quadrant abdominal pain. EXAM: CT ABDOMEN AND PELVIS WITH CONTRAST TECHNIQUE: Multidetector CT imaging of the abdomen and pelvis was performed using the standard protocol following bolus administration of intravenous contrast. CONTRAST:  100mL ISOVUE-300 IOPAMIDOL (ISOVUE-300) INJECTION 61% COMPARISON:   None. FINDINGS: Visualized lung bases are unremarkable. No significant osseous abnormalities noted. No gallstones are noted. The liver, spleen and pancreas unremarkable. Adrenal glands and kidneys appear normal. No hydronephrosis or renal obstruction is noted. The appendix is mildly enlarged 9 mm with minimal surrounding inflammatory changes. There is no evidence of bowel obstruction. No abnormal fluid collection is noted. Urinary bladder is decompressed. No significant adenopathy is noted. IMPRESSION: Appendix is mildly enlarged at 9 mm with minimal surrounding inflammatory changes. In the appropriate clinical setting, these findings would suggest acute appendicitis. Correlation with white blood count is recommended. No definite abscess or other abnormality is noted. These results will be called to the ordering clinician or representative by the Radiologist Assistant, and communication documented in the PACS or zVision Dashboard. Electronically Signed   By: Lupita RaiderJames  Green Jr, M.D.   On: 08/12/2015 15:18    Procedures Procedures (including critical care time)  Medications Ordered in ED Medications  morphine 4 MG/ML injection 4 mg (not administered)  ondansetron (ZOFRAN) injection 4 mg (not administered)  Initial Impression / Assessment and Plan / ED Course  I have reviewed the triage vital signs and the nursing notes.  Pertinent labs & imaging results that were available during my care of the patient were reviewed by me and considered in my medical decision making (see chart for details).  Clinical Course  Right lower quadrant pain with nausea. Outpatient CT scan concerning for appendicitis.  White blood cell count is normal. Will discuss with surgery.  Discussed with Dr. Janee Morn and he will take patient to the operating room for appendectomy. Vitals are stable. Final Clinical Impressions(s) / ED Diagnoses   Final diagnoses:  Acute appendicitis, unspecified acute appendicitis type     New Prescriptions New Prescriptions   No medications on file     Glynn Octave, MD 08/12/15 2338

## 2015-08-12 NOTE — ED Notes (Signed)
Called name x2 for vitals recheck. No answer.

## 2015-08-12 NOTE — Anesthesia Postprocedure Evaluation (Signed)
Anesthesia Post Note  Patient: Isidore Mooseron Brodbeck  Procedure(s) Performed: Procedure(s) (LRB): APPENDECTOMY LAPAROSCOPIC (N/A)  Patient location during evaluation: PACU Anesthesia Type: General Level of consciousness: patient uncooperative, awake, sedated and patient cooperative Pain management: pain level controlled Vital Signs Assessment: post-procedure vital signs reviewed and stable Respiratory status: spontaneous breathing and respiratory function stable Cardiovascular status: stable Anesthetic complications: no    Last Vitals:  Vitals:   08/12/15 2115 08/12/15 2245  BP: 151/91   Pulse: 84   Resp:    Temp:  (P) 36.7 C    Last Pain:  Vitals:   08/12/15 2245  TempSrc:   PainSc: (P) Asleep                 Brentley Landfair EDWARD

## 2015-08-12 NOTE — Anesthesia Procedure Notes (Signed)
Procedure Name: Intubation Date/Time: 08/12/2015 9:52 PM Performed by: Arlice ColtMANESS, [place]ar[place] Gudiel B Pre-anesthesia Checklist: Patient identified, Emergency Drugs available, Suction available, Patient being monitored and Timeout performed Patient Re-evaluated:Patient Re-evaluated prior to inductionOxygen Delivery Method: Circle system utilized Preoxygenation: Pre-oxygenation with 100% oxygen Intubation Type: IV induction and Rapid sequence Laryngoscope Size: Mac and 3 Grade View: Grade I Tube type: Oral Tube size: 7.5 mm Number of attempts: 1 Airway Equipment and Method: Stylet Placement Confirmation: ETT inserted through vocal cords under direct vision,  positive ETCO2 and breath sounds checked- equal and bilateral Secured at: 21 cm Tube secured with: Tape Dental Injury: Teeth and Oropharynx as per pre-operative assessment

## 2015-08-12 NOTE — Anesthesia Preprocedure Evaluation (Signed)
Anesthesia Evaluation  Patient identified by MRN, date of birth, ID band Patient awake    Reviewed: Allergy & Precautions, NPO status , Patient's Chart, lab work & pertinent test results  Airway Mallampati: I  TM Distance: >3 FB     Dental   Pulmonary    Pulmonary exam normal        Cardiovascular Normal cardiovascular exam     Neuro/Psych  Headaches,    GI/Hepatic   Endo/Other    Renal/GU      Musculoskeletal   Abdominal   Peds  Hematology   Anesthesia Other Findings   Reproductive/Obstetrics                            Anesthesia Physical Anesthesia Plan  ASA: I and emergent  Anesthesia Plan: General   Post-op Pain Management:    Induction: Intravenous  Airway Management Planned: Oral ETT  Additional Equipment:   Intra-op Plan:   Post-operative Plan: Extubation in OR  Informed Consent: I have reviewed the patients History and Physical, chart, labs and discussed the procedure including the risks, benefits and alternatives for the proposed anesthesia with the patient or authorized representative who has indicated his/her understanding and acceptance.     Plan Discussed with: CRNA, Anesthesiologist and Surgeon  Anesthesia Plan Comments:        Anesthesia Quick Evaluation

## 2015-08-12 NOTE — ED Notes (Signed)
Mother of patient approached desk to inquire about wait time and why others were taken back prior when they had waited longer. Advised mother that patients are roomed in order of acuity, apologies made for wait, and informed of position in queue.

## 2015-08-12 NOTE — H&P (Signed)
Ray Foley is an 25 y.o. male.   Chief Complaint: Right lower quadrant abdominal pain HPI: Ray Foley developed right lower quadrant abdominal pain this morning at 1 AM. He initially thought he needed to have a bowel movement. Despite trying to move his bowels, the pain persisted. He was seen by his primary care physician, Dr. Parke Simmers. She sent him for an outpatient CT scan of the abdomen and pelvis. This showed acute appendicitis and he was directed to the emergency department. He continues to have pain in his right lower quadrant with some mild associated nausea. No vomiting.  Past Medical History:  Diagnosis Date  . Allergic rhinitis   . Chronic headache     History reviewed. No pertinent surgical history.  No family history on file. Social History:  reports that he has never smoked. He has never used smokeless tobacco. He reports that he does not drink alcohol or use drugs.  Allergies:  Allergies  Allergen Reactions  . Other     nickle  . Propylene Carbonate      (Not in a hospital admission)  Results for orders placed or performed during the hospital encounter of 08/12/15 (from the past 48 hour(s))  Lipase, blood     Status: None   Collection Time: 08/12/15  4:53 PM  Result Value Ref Range   Lipase 19 11 - 51 U/L  Comprehensive metabolic panel     Status: Abnormal   Collection Time: 08/12/15  4:53 PM  Result Value Ref Range   Sodium 133 (L) 135 - 145 mmol/L   Potassium 4.0 3.5 - 5.1 mmol/L   Chloride 97 (L) 101 - 111 mmol/L   CO2 25 22 - 32 mmol/L   Glucose, Bld 76 65 - 99 mg/dL   BUN 15 6 - 20 mg/dL   Creatinine, Ser 3.00 (H) 0.61 - 1.24 mg/dL   Calcium 9.5 8.9 - 51.1 mg/dL   Total Protein 7.3 6.5 - 8.1 g/dL   Albumin 4.3 3.5 - 5.0 g/dL   AST 18 15 - 41 U/L   ALT 14 (L) 17 - 63 U/L   Alkaline Phosphatase 64 38 - 126 U/L   Total Bilirubin 0.8 0.3 - 1.2 mg/dL   GFR calc non Af Amer >60 >60 mL/min   GFR calc Af Amer >60 >60 mL/min    Comment: (NOTE) The eGFR has been  calculated using the CKD EPI equation. This calculation has not been validated in all clinical situations. eGFR's persistently <60 mL/min signify possible Chronic Kidney Disease.    Anion gap 11 5 - 15  CBC     Status: None   Collection Time: 08/12/15  4:53 PM  Result Value Ref Range   WBC 6.6 4.0 - 10.5 K/uL   RBC 5.32 4.22 - 5.81 MIL/uL   Hemoglobin 15.7 13.0 - 17.0 g/dL   HCT 02.1 11.7 - 35.6 %   MCV 85.5 78.0 - 100.0 fL   MCH 29.5 26.0 - 34.0 pg   MCHC 34.5 30.0 - 36.0 g/dL   RDW 70.1 41.0 - 30.1 %   Platelets 193 150 - 400 K/uL  Urinalysis, Routine w reflex microscopic     Status: Abnormal   Collection Time: 08/12/15  5:26 PM  Result Value Ref Range   Color, Urine YELLOW YELLOW   APPearance CLEAR CLEAR   Specific Gravity, Urine >1.046 (H) 1.005 - 1.030   pH 6.0 5.0 - 8.0   Glucose, UA NEGATIVE NEGATIVE mg/dL   Hgb urine dipstick  NEGATIVE NEGATIVE   Bilirubin Urine NEGATIVE NEGATIVE   Ketones, ur 40 (A) NEGATIVE mg/dL   Protein, ur NEGATIVE NEGATIVE mg/dL   Nitrite NEGATIVE NEGATIVE   Leukocytes, UA NEGATIVE NEGATIVE    Comment: MICROSCOPIC NOT DONE ON URINES WITH NEGATIVE PROTEIN, BLOOD, LEUKOCYTES, NITRITE, OR GLUCOSE <1000 mg/dL.   Ct Abdomen Pelvis W Contrast  Result Date: 08/12/2015 CLINICAL DATA:  Acute right lower quadrant abdominal pain. EXAM: CT ABDOMEN AND PELVIS WITH CONTRAST TECHNIQUE: Multidetector CT imaging of the abdomen and pelvis was performed using the standard protocol following bolus administration of intravenous contrast. CONTRAST:  115m ISOVUE-300 IOPAMIDOL (ISOVUE-300) INJECTION 61% COMPARISON:  None. FINDINGS: Visualized lung bases are unremarkable. No significant osseous abnormalities noted. No gallstones are noted. The liver, spleen and pancreas unremarkable. Adrenal glands and kidneys appear normal. No hydronephrosis or renal obstruction is noted. The appendix is mildly enlarged 9 mm with minimal surrounding inflammatory changes. There is no  evidence of bowel obstruction. No abnormal fluid collection is noted. Urinary bladder is decompressed. No significant adenopathy is noted. IMPRESSION: Appendix is mildly enlarged at 9 mm with minimal surrounding inflammatory changes. In the appropriate clinical setting, these findings would suggest acute appendicitis. Correlation with white blood count is recommended. No definite abscess or other abnormality is noted. These results will be called to the ordering clinician or representative by the Radiologist Assistant, and communication documented in the PACS or zVision Dashboard. Electronically Signed   By: JMarijo Conception M.D.   On: 08/12/2015 15:18    Review of Systems  Constitutional: Negative for fever.  HENT: Negative.   Eyes: Negative for blurred vision.  Respiratory: Negative for cough and shortness of breath.   Cardiovascular: Negative for chest pain.  Gastrointestinal: Positive for abdominal pain, constipation and nausea. Negative for vomiting.  Genitourinary: Negative.   Musculoskeletal: Negative.   Skin: Negative.   Neurological: Negative.   Endo/Heme/Allergies: Negative.   Psychiatric/Behavioral: Negative.     Blood pressure 144/64, pulse 70, temperature 98.2 F (36.8 C), temperature source Oral, resp. rate 16, height '5\' 2"'$  (1.575 m), weight 86.2 kg (190 lb), SpO2 99 %. Physical Exam  Constitutional: He is oriented to person, place, and time. He appears well-developed and well-nourished. No distress.  HENT:  Head: Normocephalic.  Right Ear: External ear normal.  Nose: Nose normal.  Mouth/Throat: Oropharynx is clear and moist.  Eyes: EOM are normal. Pupils are equal, round, and reactive to light. No scleral icterus.  Neck: Normal range of motion. No tracheal deviation present. No thyromegaly present.  Cardiovascular: Normal rate, regular rhythm, normal heart sounds and intact distal pulses.   Respiratory: Effort normal and breath sounds normal. No stridor. No respiratory  distress. He has no wheezes. He has no rales.  GI: Soft. He exhibits no distension. There is tenderness. There is guarding. There is no rebound.  Tenderness right lower quadrant with voluntary guarding, hypoactive bowel sounds, no generalized tenderness  Musculoskeletal: Normal range of motion. He exhibits no deformity.  Neurological: He is alert and oriented to person, place, and time.  Skin: Skin is warm and dry.  Psychiatric: He has a normal mood and affect.     Assessment/Plan Acute appendicitis - IV antibiotics, will proceed to the operating room for laparoscopic appendectomy. The procedure, risks, and benefits were discussed in detail with him. I discussed the expected postoperative course. He is agreeable.  TZenovia Jarred MD 08/12/2015, 9:19 PM

## 2015-08-12 NOTE — Op Note (Signed)
08/12/2015  10:41 PM  PATIENT:  Ray Foley  25 y.o. male  PRE-OPERATIVE DIAGNOSIS:  acute appendicitis  POST-OPERATIVE DIAGNOSIS:  Acute Appendicitis  PROCEDURE:  Procedure(s): APPENDECTOMY LAPAROSCOPIC  SURGEON:  Surgeon(s): Violeta GelinasBurke Timm Bonenberger, MD  ASSISTANTS: none   ANESTHESIA:   local and general  EBL:  Total I/O In: 1000 [I.V.:1000] Out: 300 [Urine:300]  BLOOD ADMINISTERED:none  DRAINS: none   SPECIMEN:  Excision  DISPOSITION OF SPECIMEN:  PATHOLOGY  COUNTS:  YES  DICTATION: .Dragon Dictation Findings: Inflamed but not perforated appendix  Procedure in detail: Ray Foley presents for appendectomy. He was identified in the preop holding area. Informed consent was obtained. He received intravenous antibiotic. He is brought In a room and general endotracheal anesthesia was administered by the anesthesia staff. Foley catheter was placed by nursing. His abdomen was prepped and draped in sterile fashion. Time out procedure was performed. The infraumbilical region was infiltrated with local. Infraumbilical incision was made. Subcutaneous tissues were dissected down revealing the anterior fascia. This was divided sharply along the midline. Peritoneal cavity was entered under direct vision without complication. A 0 Vicryl pursestring was placed around the fascial opening. Hassan trocar was inserted into the abdomen. The abdomen was insufflated with carbon dioxide in standard fashion. Under direct vision a 12 mm left lower quadrant and a 5 mm right mid abdomen port were placed. Local was used at each port site. Laparoscopic exploration revealed an inflamed but not perforated appendix. The mesoappendix was divided with the harmonic scalpel achieving excellent hemostasis. The base was then divided with an Endo GIA with vascular load. The appendix was placed in a bag and removed from the abdomen. Staple line was intact. There was no bleeding. The abdomen was irrigated. Irrigation fluid was  evacuated. Hemostasis was ensured. Ports were then removed under direct vision. Pneumoperitoneum was released. Infra-umbilical fascia was closed by tying the pursestring. All 3 wounds were copiously irrigated and then closed with running 4-0 Vicryl subcuticular followed by liquid band. All counts were correct. He tolerated the procedure well without apparent complication and was taken recovery in stable condition.  PATIENT DISPOSITION:  PACU - hemodynamically stable.   Delay start of Pharmacological VTE agent (>24hrs) due to surgical blood loss or risk of bleeding:  no  Violeta GelinasBurke Ruhee Enck, MD, MPH, FACS Pager: [phone]  8/10/201710:41 PM

## 2015-08-12 NOTE — ED Triage Notes (Signed)
Patient here to be evaluated for early appendicitis. Had CT done earlier today and here for evaluation by surgeon. Developed RLQ pain today at 0200, pain has remained persistent, no nausea, no vomiting, NAD

## 2015-08-12 NOTE — Transfer of Care (Signed)
Immediate Anesthesia Transfer of Care Note  Patient: Ray Foley  Procedure(s) Performed: Procedure(s): APPENDECTOMY LAPAROSCOPIC (N/A)  Patient Location: PACU  Anesthesia Type:General  Level of Consciousness: awake and alert   Airway & Oxygen Therapy: Patient Spontanous Breathing  Post-op Assessment: Report given to RN and Post -op Vital signs reviewed and stable  Post vital signs: Reviewed and stable  Last Vitals:  Vitals:   08/12/15 2115 08/12/15 2245  BP: 151/91   Pulse: 84   Resp:    Temp:  (P) 36.7 C    Last Pain:  Vitals:   08/12/15 2245  TempSrc:   PainSc: (P) Asleep         Complications: No apparent anesthesia complications

## 2015-08-13 ENCOUNTER — Encounter (HOSPITAL_COMMUNITY): Payer: Self-pay | Admitting: General Surgery

## 2015-08-13 MED ORDER — MORPHINE SULFATE (PF) 2 MG/ML IV SOLN: 2.0000 mg | INTRAVENOUS | Status: DC | PRN

## 2015-08-13 MED ORDER — PHENOL 1.4 % MT LIQD
1.0000 | OROMUCOSAL | Status: DC | PRN
  Administered 2015-08-13: 1 via OROMUCOSAL
  Filled 2015-08-13: qty 177

## 2015-08-13 MED ORDER — OXYCODONE HCL 5 MG PO TABS
5.0000 mg | ORAL_TABLET | ORAL | Status: DC | PRN
  Administered 2015-08-14: 10 mg via ORAL
  Filled 2015-08-13: qty 2

## 2015-08-13 MED ORDER — ACETAMINOPHEN 325 MG PO TABS
650.0000 mg | ORAL_TABLET | Freq: Four times a day (QID) | ORAL | Status: DC | PRN
  Administered 2015-08-13 – 2015-08-14 (×3): 650 mg via ORAL
  Filled 2015-08-13 (×3): qty 2

## 2015-08-13 MED ORDER — ACETAMINOPHEN 650 MG RE SUPP: 650.0000 mg | Freq: Four times a day (QID) | RECTAL | Status: DC | PRN

## 2015-08-13 MED ORDER — ONDANSETRON 4 MG PO TBDP: 4.0000 mg | ORAL_TABLET | Freq: Four times a day (QID) | ORAL | Status: DC | PRN

## 2015-08-13 MED ORDER — ONDANSETRON HCL 4 MG/2ML IJ SOLN: 4.0000 mg | Freq: Four times a day (QID) | INTRAMUSCULAR | Status: DC | PRN

## 2015-08-13 MED ORDER — ENOXAPARIN SODIUM 40 MG/0.4ML ~~LOC~~ SOLN
40.0000 mg | SUBCUTANEOUS | Status: DC
  Administered 2015-08-14: 40 mg via SUBCUTANEOUS
  Filled 2015-08-13: qty 0.4

## 2015-08-13 NOTE — Progress Notes (Signed)
1 Day Post-Op  Subjective: Comfortable Minimal pain  Objective: Vital signs in last 24 hours: Temp:  [98.1 F (36.7 C)-101.8 F (38.8 C)] 101.8 F (38.8 C) (08/11 0600) Pulse Rate:  [70-108] 108 (08/11 0509) Resp:  [16-21] 18 (08/11 0509) BP: (102-151)/(51-92) 102/51 (08/11 0509) SpO2:  [95 %-100 %] 96 % (08/11 0509) Weight:  [86.2 kg (190 lb)] 86.2 kg (190 lb) (08/10 1645) Last BM Date: 08/12/15  Intake/Output from previous day: 08/10 0701 - 08/11 0700 In: 1413.3 [I.V.:1413.3] Out: 595 [Urine:575; Blood:20] Intake/Output this shift: No intake/output data recorded.  Lungs clear Abdomen soft, minimally tender  Lab Results:   Recent Labs  08/12/15 1653  WBC 6.6  HGB 15.7  HCT 45.5  PLT 193   BMET  Recent Labs  08/12/15 1653  NA 133*  K 4.0  CL 97*  CO2 25  GLUCOSE 76  BUN 15  CREATININE 1.29*  CALCIUM 9.5   PT/INR No results for input(s): LABPROT, INR in the last 72 hours. ABG No results for input(s): PHART, HCO3 in the last 72 hours.  Invalid input(s): PCO2, PO2  Studies/Results: Ct Abdomen Pelvis W Contrast  Result Date: 08/12/2015 CLINICAL DATA:  Acute right lower quadrant abdominal pain. EXAM: CT ABDOMEN AND PELVIS WITH CONTRAST TECHNIQUE: Multidetector CT imaging of the abdomen and pelvis was performed using the standard protocol following bolus administration of intravenous contrast. CONTRAST:  100mL ISOVUE-300 IOPAMIDOL (ISOVUE-300) INJECTION 61% COMPARISON:  None. FINDINGS: Visualized lung bases are unremarkable. No significant osseous abnormalities noted. No gallstones are noted. The liver, spleen and pancreas unremarkable. Adrenal glands and kidneys appear normal. No hydronephrosis or renal obstruction is noted. The appendix is mildly enlarged 9 mm with minimal surrounding inflammatory changes. There is no evidence of bowel obstruction. No abnormal fluid collection is noted. Urinary bladder is decompressed. No significant adenopathy is noted.  IMPRESSION: Appendix is mildly enlarged at 9 mm with minimal surrounding inflammatory changes. In the appropriate clinical setting, these findings would suggest acute appendicitis. Correlation with white blood count is recommended. No definite abscess or other abnormality is noted. These results will be called to the ordering clinician or representative by the Radiologist Assistant, and communication documented in the PACS or zVision Dashboard. Electronically Signed   By: Lupita RaiderJames  Green Jr, M.D.   On: 08/12/2015 15:18    Anti-infectives: Anti-infectives    Start     Dose/Rate Route Frequency Ordered Stop   08/12/15 2145  cefTRIAXone (ROCEPHIN) 2 g in dextrose 5 % 50 mL IVPB     2 g 100 mL/hr over 30 Minutes Intravenous  Once 08/12/15 2123 08/12/15 2154   08/12/15 2145  metroNIDAZOLE (FLAGYL) IVPB 500 mg     500 mg 100 mL/hr over 60 Minutes Intravenous  Once 08/12/15 2123        Assessment/Plan: s/p Procedure(s): APPENDECTOMY LAPAROSCOPIC (N/A)  Post op fever.  Work on [employer] Ambulate Start clear liquids. Home this afternoon vs tomorrow  LOS: 0 days    Galya Dunnigan A 08/13/2015

## 2015-08-13 NOTE — Progress Notes (Signed)
MD on call made aware of increased temp. Tylenol given, encourage IS.No new order given.

## 2015-08-13 NOTE — Progress Notes (Signed)
Patient febrile. Temp 100.2 when rechecked. Tylenol  given. Encourage to use incentive spirometry.

## 2015-08-14 MED ORDER — OXYCODONE HCL 5 MG PO TABS: 5.0000 mg | ORAL_TABLET | ORAL | 0 refills | Status: AC | PRN

## 2015-08-14 NOTE — Discharge Instructions (Signed)
[facility] ______[facility] LAPAROSCOPIC SURGERY: POST OP INSTRUCTIONS Always review your discharge instruction sheet given to you by the facility where your surgery was performed. IF YOU HAVE DISABILITY OR FAMILY LEAVE FORMS, YOU MUST BRING THEM TO THE OFFICE FOR PROCESSING.   DO NOT GIVE THEM TO YOUR DOCTOR.  1. A prescription for pain medication may be given to you upon discharge.  Take your pain medication as prescribed, if needed.  If narcotic pain medicine is not needed, then you may take acetaminophen (Tylenol) or ibuprofen (Advil) as needed. 2. Take your usually prescribed medications unless otherwise directed. 3. If you need a refill on your pain medication, please contact your pharmacy.  They will contact our office to request authorization. Prescriptions will not be filled after 5pm or on week-ends. 4. You should follow a light diet the first few days after arrival home, such as soup and crackers, etc.  Be sure to include lots of fluids daily. 5. Most patients will experience some swelling and bruising in the area of the incisions.  Ice packs will help.  Swelling and bruising can take several days to resolve.  6. It is common to experience some constipation if taking pain medication after surgery.  Increasing fluid intake and taking a stool softener (such as Colace) will usually help or prevent this problem from occurring.  A mild laxative (Milk of Magnesia or Miralax) should be taken according to package instructions if there are no bowel movements after 48 hours. 7. Unless discharge instructions indicate otherwise, you may remove your bandages 24-48 hours after surgery, and you may shower at that time.  You may have steri-strips (small skin tapes) in place directly over the incision.  These strips should be left on the skin for 7-10 days.  If your surgeon used skin glue on the incision, you may shower in 24 hours.  The glue will flake off over the next 2-3 weeks.  Any sutures or  staples will be removed at the office during your follow-up visit. 8. ACTIVITIES:  You may resume regular (light) daily activities beginning the next day--such as daily self-care, walking, climbing stairs--gradually increasing activities as tolerated.  You may have sexual intercourse when it is comfortable.  Refrain from any heavy lifting or straining until approved by your doctor. a. You may drive when you are no longer taking prescription pain medication, you can comfortably wear a seatbelt, and you can safely maneuver your car and apply brakes. b. RETURN TO WORK:  ___OK TO GO BACK TO WORK MONDAY_______________________________________________________ 9. You should see your doctor in the office for a follow-up appointment approximately 2-3 weeks after your surgery.  Make sure that you call for this appointment within a day or two after you arrive home to insure a convenient appointment time. 10. OTHER INSTRUCTIONS: TAKE MIRALAX DAILY 11. ICE PACK AND IBUPROFEN ALSO FOR PAIN 12. NO LIFTING OVER 15 POUNDS FOR 2 WEEKS __________________________________________________________________________________________________________________________ __________________________________________________________________________________________________________________________ WHEN TO CALL YOUR DOCTOR: 1. Fever over 101.0 2. Inability to urinate 3. Continued bleeding from incision. 4. Increased pain, redness, or drainage from the incision. 5. Increasing abdominal pain  The clinic staff is available to answer your questions during regular business hours.  Please dont hesitate to call and ask to speak to one of the nurses for clinical concerns.  If you have a medical emergency, go to the nearest emergency room or call 911.  A surgeon from [facility] is always on call at the hospital. [address]  [address], [place], [place], [place]  [address] ? [address], [place], [place]   [address] [phone] ?  [phone] ? FAX [phone] Web site: [email or web address]

## 2015-08-14 NOTE — Progress Notes (Signed)
Patient ID: Ray Foley, male   DOB: 1990-02-06, 25 y.o.   MRN: [id number]  Doing well No fevers Abdomen soft  Plan: discharge

## 2015-08-14 NOTE — Discharge Summary (Signed)
Physician Discharge Summary  Patient ID: Isidore Mooseron Havrilla MRN: [id number] DOB/AGE: 23-18-1992 23 y.o.  Admit date: 08/12/2015 Discharge date: 08/14/2015  Admission Diagnoses:  Discharge Diagnoses:  Active Problems:   S/P laparoscopic appendectomy   Discharged Condition: good  Hospital Course: uneventful post op recovery s/p surgery  Consults: None  Significant Diagnostic Studies:   Treatments: surgery: laparoscopic appendectomy  Discharge Exam: Blood pressure 123/69, pulse 97, temperature 98.4 F (36.9 C), temperature source Oral, resp. rate 19, height 5\' 2"  (1.575 m), weight 86.2 kg (190 lb), SpO2 98 %. General appearance: alert, cooperative and no distress Resp: clear to auscultation bilaterally Cardio: regular rate and rhythm, S1, S2 normal, no murmur, click, rub or gallop Incision/Wound:abdomen soft, incisions clean  Disposition: 01-Home or Self Care     Medication List    STOP taking these medications   amoxicillin-clavulanate 875-125 MG tablet Commonly known as:  AUGMENTIN   benzonatate 100 MG capsule Commonly known as:  TESSALON   fluticasone 50 MCG/ACT nasal spray Commonly known as:  FLONASE   ipratropium 0.06 % nasal spray Commonly known as:  ATROVENT   meclizine 50 MG tablet Commonly known as:  ANTIVERT   ondansetron 4 MG tablet Commonly known as:  ZOFRAN   pseudoephedrine 60 MG tablet Commonly known as:  SUDAFED     TAKE these medications   oxyCODONE 5 MG immediate release tablet Commonly known as:  Oxy IR/ROXICODONE Take 1-2 tablets (5-10 mg total) by mouth every 4 (four) hours as needed for moderate pain.      Follow-up Information    THOMPSON,BURKE E, MD. Schedule an appointment as soon as possible for a visit in 2 week(s).   Specialty:  General Surgery Why:  call monday moring Contact information: [address] [place] [address] [phone]        Cameron ProudBrian M Thompson, MD .   Specialty:  Neurology Contact  information: [address] [place] [id number] [phone]           Signed: Shelly RubensteinBLACKMAN,Nikkol Pai A 08/14/2015, 10:35 AM

## 2016-10-08 IMAGING — CT CT ABD-PELV W/ CM
1 of 2 series · 15 of 32 positions shown, 19 images · IV contrast (iopamidol)
Comparison: None.

CLINICAL DATA: Acute right lower quadrant abdominal pain.

EXAM:
CT ABDOMEN AND PELVIS WITH CONTRAST
TECHNIQUE: Multidetector CT imaging of the abdomen and pelvis was performed
using the standard protocol following bolus administration of
intravenous contrast.
CONTRAST:  100mL CAU8IQ-QEE IOPAMIDOL (CAU8IQ-QEE) INJECTION 61%

[Series 2: abd/pelvis w/cm · axial · 0.74mm/px · z∈[-407,-2]mm · 15 of 89 slices shown, 19 images]
[im 4/89  soft-tissue]
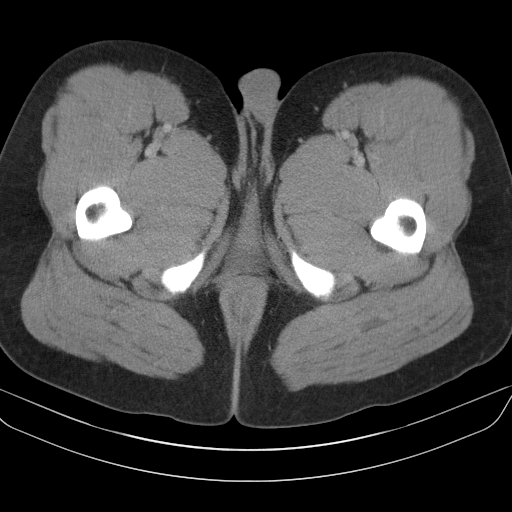
[im 4/89  bone]
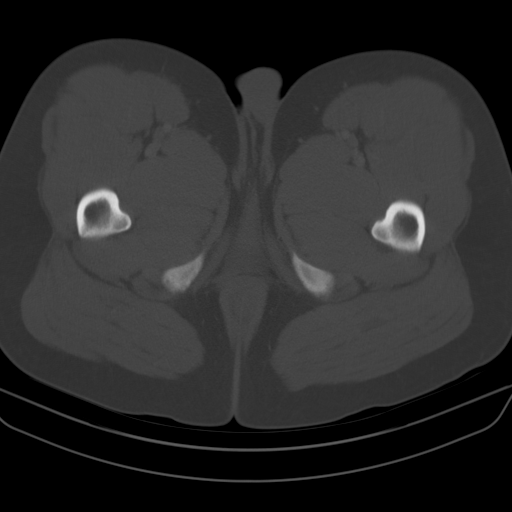
[im 11/89  soft-tissue]
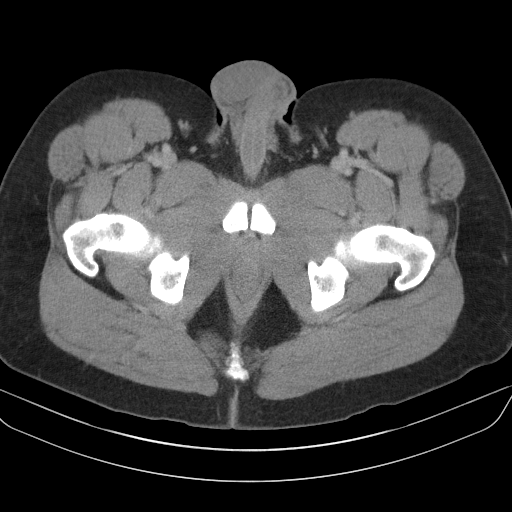
[im 18/89  soft-tissue]
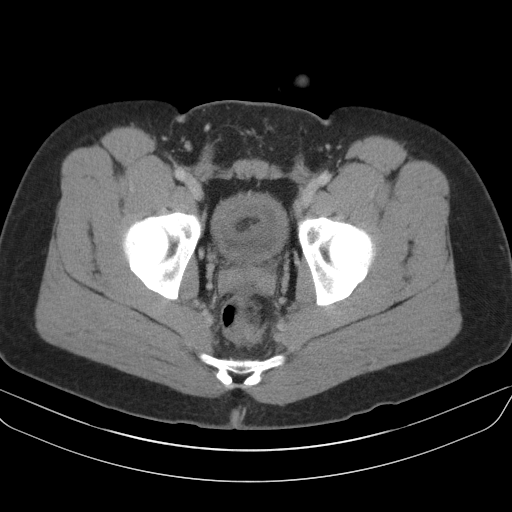
[im 25/89  soft-tissue]
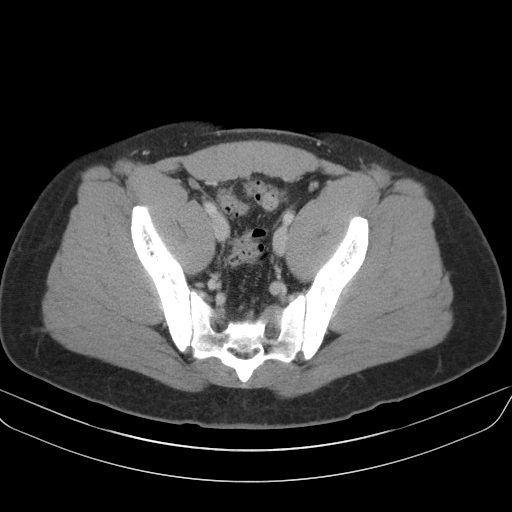
[im 32/89  soft-tissue]
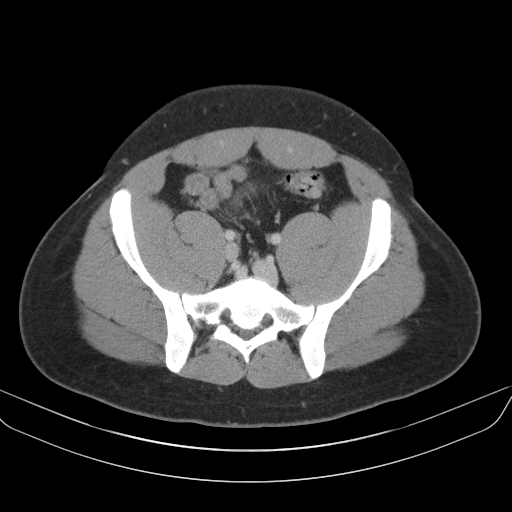
[im 39/89  soft-tissue]
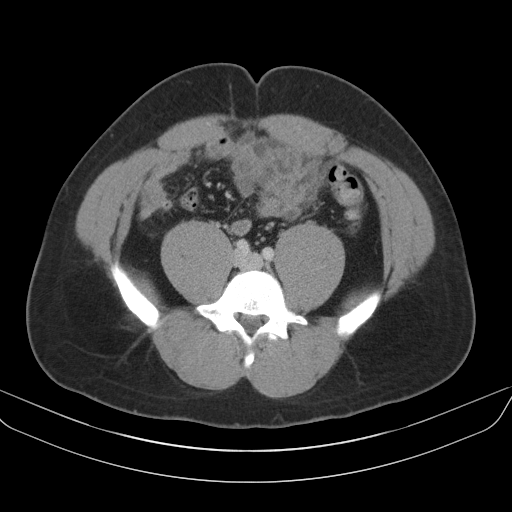
[im 46/89  soft-tissue]
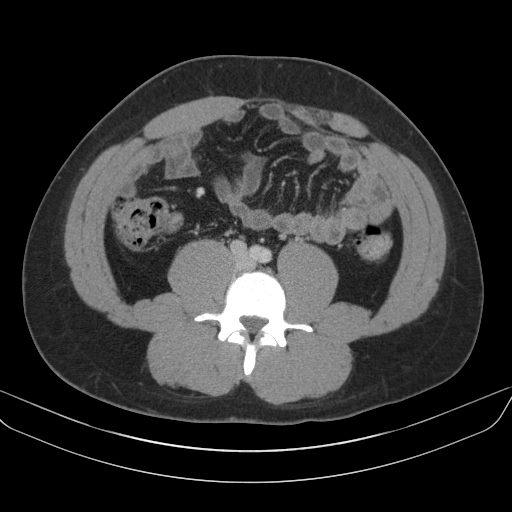
[im 50/89  soft-tissue]
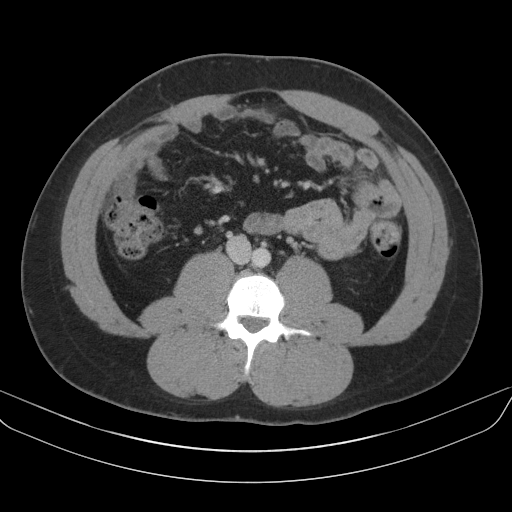
[im 57/89  soft-tissue]
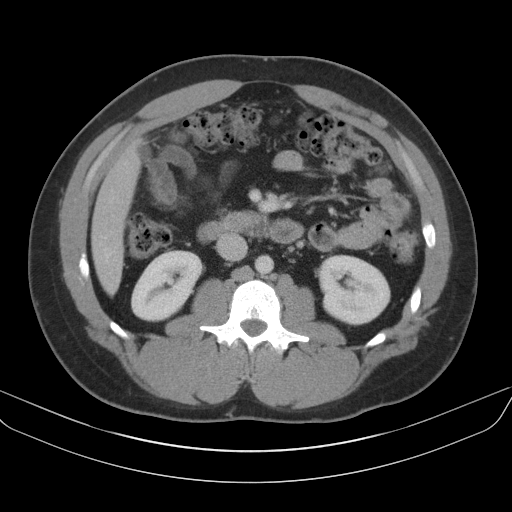
[im 57/89  bone]
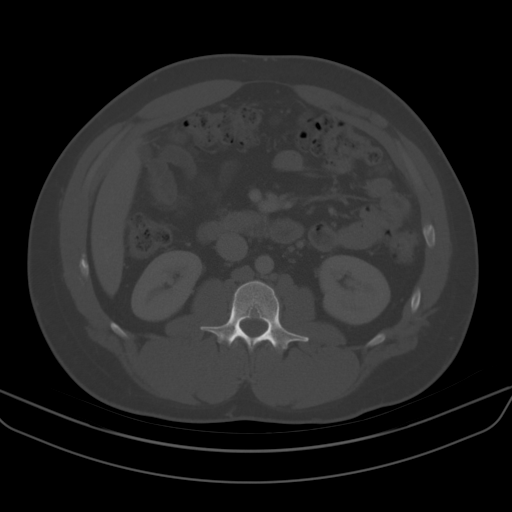
[im 64/89  soft-tissue]
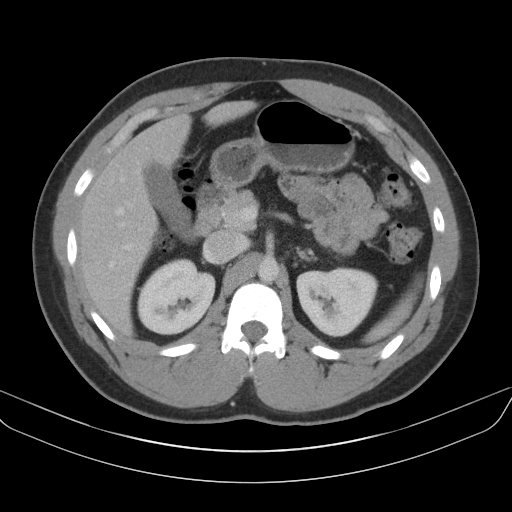
[im 71/89  soft-tissue]
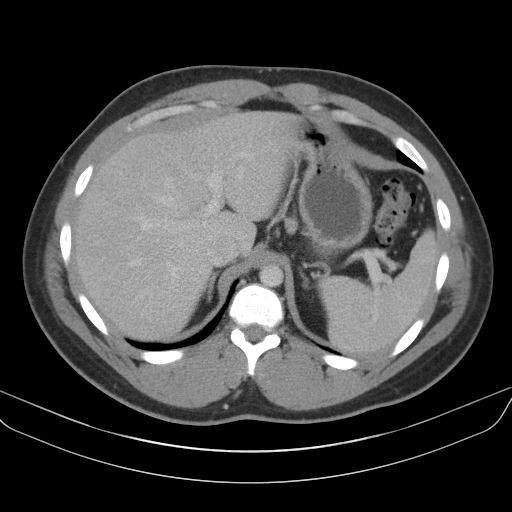
[im 74/89  lung]
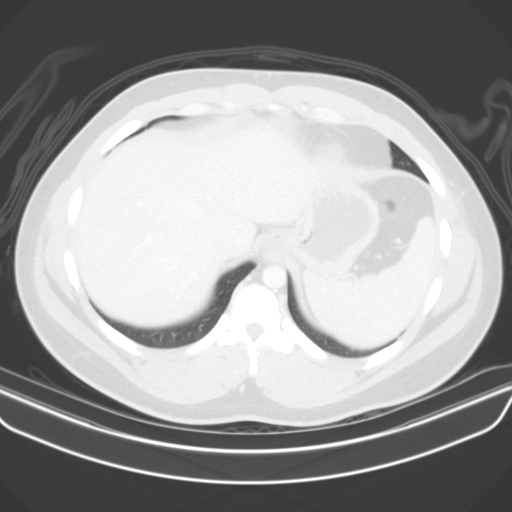
[im 78/89  soft-tissue]
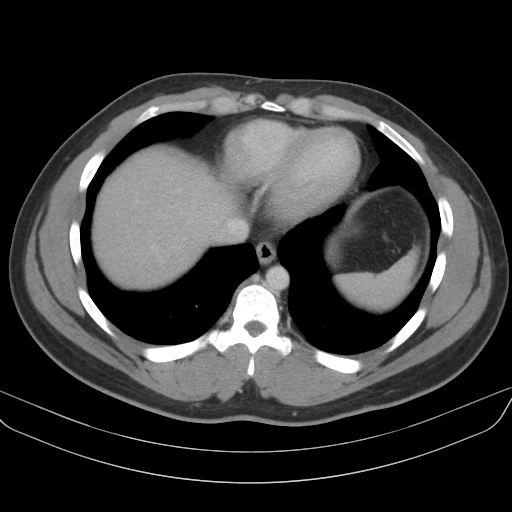
[im 78/89  lung]
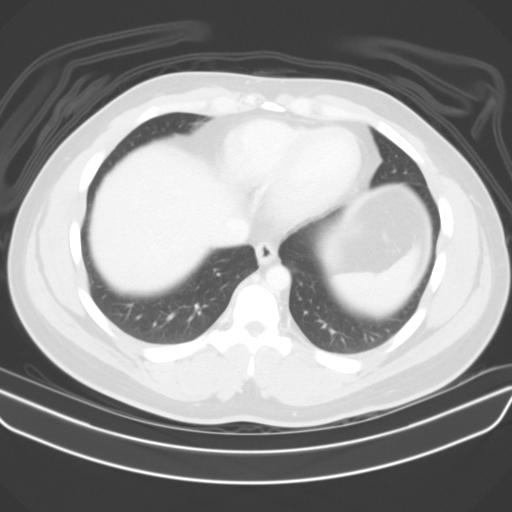
[im 81/89  lung]
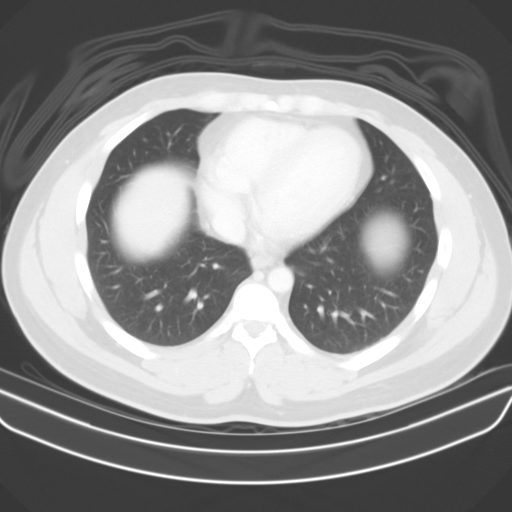
[im 85/89  soft-tissue]
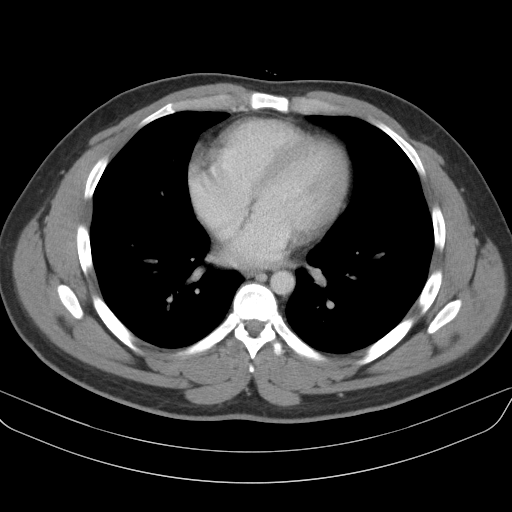
[im 85/89  lung]
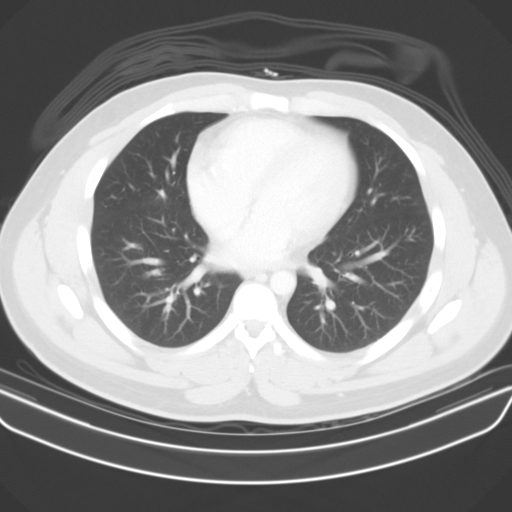

[15 of 32 positions shown; findings below may reference images not displayed]

FINDINGS: Visualized lung bases are unremarkable. No significant osseous
abnormalities noted.

No gallstones are noted. The liver, spleen and pancreas
unremarkable. Adrenal glands and kidneys appear normal. No
hydronephrosis or renal obstruction is noted. The appendix is mildly
enlarged 9 mm with minimal surrounding inflammatory changes. There
is no evidence of bowel obstruction. No abnormal fluid collection is
noted. Urinary bladder is decompressed. No significant adenopathy is
noted.
IMPRESSION: Appendix is mildly enlarged at 9 mm with minimal surrounding
inflammatory changes. In the appropriate clinical setting, these
findings would suggest acute appendicitis. Correlation with white
blood count is recommended. No definite abscess or other abnormality
is noted. These results will be called to the ordering clinician or
representative by the Radiologist Assistant, and communication
documented in the PACS or zVision Dashboard.

## 2016-11-22 ENCOUNTER — Ambulatory Visit (HOSPITAL_COMMUNITY)
Admission: EM | Admit: 2016-11-22 | Discharge: 2016-11-22 | Disposition: A | Discharge status: Home / Self Care | Payer: BLUE CROSS/BLUE SHIELD | Attending: Family Medicine | Admitting: Family Medicine

## 2016-11-22 ENCOUNTER — Encounter (HOSPITAL_COMMUNITY): Payer: Self-pay | Admitting: Emergency Medicine

## 2016-11-22 DIAGNOSIS — Z9889 Other specified postprocedural states: Secondary | ICD-10-CM | POA: Insufficient documentation

## 2016-11-22 DIAGNOSIS — Z79891 Long term (current) use of opiate analgesic: Secondary | ICD-10-CM | POA: Diagnosis not present

## 2016-11-22 DIAGNOSIS — Z79899 Other long term (current) drug therapy: Secondary | ICD-10-CM | POA: Insufficient documentation

## 2016-11-22 DIAGNOSIS — Z888 Allergy status to other drugs, medicaments and biological substances status: Secondary | ICD-10-CM | POA: Insufficient documentation

## 2016-11-22 DIAGNOSIS — J069 Acute upper respiratory infection, unspecified: Secondary | ICD-10-CM | POA: Insufficient documentation

## 2016-11-22 DIAGNOSIS — J00 Acute nasopharyngitis [common cold]: Secondary | ICD-10-CM | POA: Diagnosis present

## 2016-11-22 DIAGNOSIS — R51 Headache: Secondary | ICD-10-CM | POA: Insufficient documentation

## 2016-11-22 LAB — POCT RAPID STREP A: Streptococcus, Group A Screen (Direct): NEGATIVE

## 2016-11-22 MED ORDER — CETIRIZINE-PSEUDOEPHEDRINE ER 5-120 MG PO TB12: 1.0000 | ORAL_TABLET | Freq: Every day | ORAL | 0 refills | Status: AC

## 2016-11-22 MED ORDER — BENZONATATE 100 MG PO CAPS: 100.0000 mg | ORAL_CAPSULE | Freq: Three times a day (TID) | ORAL | 0 refills | Status: AC

## 2016-11-22 MED ORDER — FLUTICASONE PROPIONATE 50 MCG/ACT NA SUSP: 2.0000 | Freq: Every day | NASAL | 0 refills | Status: AC

## 2016-11-22 MED ORDER — LIDOCAINE VISCOUS 2 % MT SOLN: 15.0000 mL | OROMUCOSAL | 0 refills | Status: AC | PRN

## 2016-11-22 NOTE — ED Provider Notes (Signed)
[facility]    CSN: [id number] Arrival date & time: 11/22/16  1356     History   Chief Complaint Chief Complaint  Patient presents with  . URI    HPI Ray Foley is a 26 y.o. male.   26 year old male comes in for 5 day history of URI symptoms. He is experiencing nasal congestion, productive cough, right ear fullness. States he has sore throat and would like to make sure it is not strep, though also notice sore on tongue and states that could also be causing the pain. Denies fever, chills, night sweats. otc theraflu, mucinex, sudafed without relief. Denies chest pain, shortness of breath, trouble breathing. No sick contact. Never smoker.       Past Medical History:  Diagnosis Date  . Allergic rhinitis   . Chronic headache     Patient Active Problem List   Diagnosis Date Noted  . S/P laparoscopic appendectomy 08/12/2015  . ALLERGIC RHINITIS, SEASONAL 05/11/2008  . SKIN RASH 05/11/2008  . HEADACHE 05/11/2008    Past Surgical History:  Procedure Laterality Date  . LAPAROSCOPIC APPENDECTOMY N/A 08/12/2015   Procedure: APPENDECTOMY LAPAROSCOPIC;  Surgeon: Violeta GelinasBurke Thompson, MD;  Location: [facility];  Service: General;  Laterality: N/A;       Home Medications    Prior to Admission medications   Medication Sig Start Date End Date Taking? Authorizing Provider  benzonatate (TESSALON) 100 MG capsule Take 1 capsule (100 mg total) by mouth every 8 (eight) hours. 11/22/16   Cathie HoopsYu, Amy V, PA-C  cetirizine-pseudoephedrine (ZYRTEC-D) 5-120 MG tablet Take 1 tablet by mouth daily. 11/22/16   Cathie HoopsYu, Amy V, PA-C  fluticasone (FLONASE) 50 MCG/ACT nasal spray Place 2 sprays into both nostrils daily. 11/22/16   Cathie HoopsYu, Amy V, PA-C  lidocaine (XYLOCAINE) 2 % solution Use as directed 15 mLs in the mouth or throat as needed for mouth pain. Swish and spit, do not swallow. 11/22/16   Cathie HoopsYu, Amy V, PA-C  oxyCODONE (OXY IR/ROXICODONE) 5 MG immediate release tablet Take 1-2 tablets (5-10 mg total) by  mouth every 4 (four) hours as needed for moderate pain. 08/14/15   Abigail MiyamotoBlackman, Douglas, MD    Family History History reviewed. No pertinent family history.  Social History Social History   Tobacco Use  . Smoking status: Never Smoker  . Smokeless tobacco: Never Used  Substance Use Topics  . Alcohol use: No  . Drug use: No     Allergies   Other and Propylene carbonate   Review of Systems Review of Systems  Reason unable to perform ROS: See HPI as above.     Physical Exam Triage Vital Signs ED Triage Vitals [11/22/16 1434]  Enc Vitals Group     BP (!) 144/76     Pulse Rate 71     Resp 16     Temp 98.4 F (36.9 C)     Temp Source Oral     SpO2 100 %     Weight      Height      Head Circumference      Peak Flow      Pain Score      Pain Loc      Pain Edu?      Excl. in GC?    No data found.  Updated Vital Signs BP (!) 144/76 (BP Location: Left Arm)   Pulse 71   Temp 98.4 F (36.9 C) (Oral)   Resp 16   SpO2 100%  Physical Exam  Constitutional: He is oriented to person, place, and time. He appears well-developed and well-nourished. No distress.  HENT:  Head: Normocephalic and atraumatic.  Right Ear: Tympanic membrane, external ear and ear canal normal. Tympanic membrane is not erythematous and not bulging.  Left Ear: External ear and ear canal normal. Tympanic membrane is erythematous. Tympanic membrane is not bulging.  Nose: Mucosal edema and rhinorrhea present. Right sinus exhibits no maxillary sinus tenderness and no frontal sinus tenderness. Left sinus exhibits no maxillary sinus tenderness and no frontal sinus tenderness.  Mouth/Throat: Uvula is midline, oropharynx is clear and moist and mucous membranes are normal. Tonsils are 1+ on the right. Tonsils are 1+ on the left. No tonsillar exudate.  Isolated sore on left side of mouth consistent with aphthous stomatitis  Eyes: Conjunctivae are normal. Pupils are equal, round, and reactive to light.  Neck:  Normal range of motion. Neck supple.  Cardiovascular: Normal rate, regular rhythm and normal heart sounds. Exam reveals no gallop and no friction rub.  No murmur heard. Pulmonary/Chest: Effort normal and breath sounds normal. He has no decreased breath sounds. He has no wheezes. He has no rhonchi. He has no rales.  Lymphadenopathy:    He has no cervical adenopathy.  Neurological: He is alert and oriented to person, place, and time.  Skin: Skin is warm and dry.  Psychiatric: He has a normal mood and affect. His behavior is normal. Judgment normal.     UC Treatments / Results  Labs (all labs ordered are listed, but only abnormal results are displayed) Labs Reviewed  CULTURE, [facility] ST[facility]    EKG  EKG Interpretation None       Radiology No results found.  Procedures Procedures (including critical care time)  Medications Ordered in UC Medications - No data to display   Initial Impression / Assessment and Plan / UC Course  I have reviewed the triage vital signs and the nursing notes.  Pertinent labs & imaging results that were available during my care of the patient were reviewed by me and considered in my medical decision making (see chart for details).    Discussed with patient history and exam most consistent with viral URI. Symptomatic treatment as needed. Push fluids. Return precautions given.   Final Clinical Impressions(s) / UC Diagnoses   Final diagnoses:  Acute nasopharyngitis    ED Discharge Orders        Ordered    benzonatate (TESSALON) 100 MG capsule  Every 8 hours     11/22/16 1524    fluticasone (FLONASE) 50 MCG/ACT nasal spray  Daily     11/22/16 1524    cetirizine-pseudoephedrine (ZYRTEC-D) 5-120 MG tablet  Daily     11/22/16 1524    lidocaine (XYLOCAINE) 2 % solution  As needed     11/22/16 1524        Belinda FisherYu, Amy V, PA-C 11/22/16 1526

## 2016-11-22 NOTE — ED Triage Notes (Signed)
PT C/O: cold sx  ONSET: 5 days  SX INCLUDE: nasal congestion, prod cough, right ear fullness  DENIES: fevers  TAKING MEDS: OTC Thera flu, Mucinex, Sudafed    A&O x4... NAD... Ambulatory

## 2016-11-22 NOTE — Discharge Instructions (Addendum)
Rapid strep negative. Second swab will be sent for culture, if anything comes up positive, you will receive a call, additional medications needed will be called in then. Tessalon for cough. Start flonase, zyrtec-D for nasal congestion. You can use over the counter nasal saline rinse such as neti pot for nasal congestion. Keep hydrated, your urine should be clear to pale yellow in color. Tylenol/motrin for fever and pain. Keep good oral hygiene, to help with canker sore. You can use lidocaine solution to rinse your mouth for pain relief. Monitor for any worsening of symptoms, chest pain, shortness of breath, wheezing, swelling of the throat, follow up for reevaluation.

## 2016-11-25 LAB — CULTURE, GROUP A STREP (THRC)

## 2017-08-27 ENCOUNTER — Other Ambulatory Visit: Payer: Self-pay

## 2017-08-27 ENCOUNTER — Emergency Department (HOSPITAL_COMMUNITY)
Admission: EM | Admit: 2017-08-27 | Discharge: 2017-08-28 | Disposition: A | Discharge status: Home / Self Care | Payer: BLUE CROSS/BLUE SHIELD | Attending: Emergency Medicine | Admitting: Emergency Medicine

## 2017-08-27 ENCOUNTER — Encounter (HOSPITAL_COMMUNITY): Payer: Self-pay | Admitting: Emergency Medicine

## 2017-08-27 ENCOUNTER — Emergency Department (HOSPITAL_COMMUNITY): Payer: BLUE CROSS/BLUE SHIELD

## 2017-08-27 DIAGNOSIS — M549 Dorsalgia, unspecified: Secondary | ICD-10-CM | POA: Diagnosis not present

## 2017-08-27 DIAGNOSIS — R079 Chest pain, unspecified: Secondary | ICD-10-CM | POA: Diagnosis present

## 2017-08-27 DIAGNOSIS — R0789 Other chest pain: Secondary | ICD-10-CM | POA: Insufficient documentation

## 2017-08-27 LAB — BASIC METABOLIC PANEL
ANION GAP: 7 (ref 5–15)
BUN: 12 mg/dL (ref 6–20)
CO2: 28 mmol/L (ref 22–32)
Calcium: 9.1 mg/dL (ref 8.9–10.3)
Chloride: 105 mmol/L (ref 98–111)
Creatinine, Ser: 1.29 mg/dL — ABNORMAL HIGH (ref 0.61–1.24)
GFR calc non Af Amer: >60 mL/min (ref >60)
GLUCOSE: 82 mg/dL (ref 70–99)
Potassium: 4 mmol/L (ref 3.5–5.1)
SODIUM: 140 mmol/L (ref 135–145)

## 2017-08-27 LAB — CBC
HEMATOCRIT: 45.7 % (ref 39.0–52.0)
Hemoglobin: 14.7 g/dL (ref 13.0–17.0)
MCH: 29.6 pg (ref 26.0–34.0)
MCHC: 32.2 g/dL (ref 30.0–36.0)
MCV: 92.1 fL (ref 78.0–100.0)
Platelets: 198 10*3/uL (ref 150–400)
RBC: 4.96 MIL/uL (ref 4.22–5.81)
RDW: 13.2 % (ref 11.5–15.5)
WBC: 6.1 10*3/uL (ref 4.0–10.5)

## 2017-08-27 LAB — I-STAT TROPONIN, ED: Troponin i, poc: 0.03 ng/mL (ref 0.00–0.08)

## 2017-08-27 NOTE — ED Triage Notes (Signed)
Pt reports upper back pain that started yesterday afternoon. Pt reports picking up a heavy table that may have caused his back pain. Pt also reports intermittent central chest pressure that started today after going to the chiropractor or after eating lunch at [employer].

## 2017-08-28 MED ORDER — NAPROXEN 500 MG PO TABS: 500.0000 mg | ORAL_TABLET | Freq: Two times a day (BID) | ORAL | 0 refills | Status: AC | PRN

## 2017-08-28 MED ORDER — GI COCKTAIL ~~LOC~~
30.0000 mL | Freq: Once | ORAL | Status: AC
  Administered 2017-08-28: 30 mL via ORAL
  Filled 2017-08-28: qty 30

## 2017-08-28 MED ORDER — OXYCODONE-ACETAMINOPHEN 5-325 MG PO TABS: 2.0000 | ORAL_TABLET | Freq: Once | ORAL | Status: DC

## 2017-08-28 NOTE — ED Provider Notes (Signed)
[facility] EMERGENCY DEPARTMENT Provider Note   CSN: [id number] Arrival date & time: 08/27/17  2257    History   Chief Complaint Chief Complaint  Patient presents with  . Chest Pain  . Back Pain    HPI Ray Foley is a 27 y.o. male.  27 year old male with no significant past medical history presents to the emergency department for evaluation of chest pain.  States that pain is intermittent, sharp.  Notes it is slightly worse when changing from a supine to upright position.  Began after leaving his chiropractor's office and going to [employer] for lunch.  Denies frequent belching, vomiting.  No medications taken PTA.  Reports preceding mid back pain onset yesterday after moving a heavy table.  This pain was the precipitating factor for his visit to his chiropractor.  No fevers, vomiting, syncope, cough, hemoptysis, leg swelling.  No recent surgeries, hospitalizations.  Denies hx of DM, HTN, HLD.  Is a never smoker.     Past Medical History:  Diagnosis Date  . Allergic rhinitis   . Chronic headache     Patient Active Problem List   Diagnosis Date Noted  . S/P laparoscopic appendectomy 08/12/2015  . ALLERGIC RHINITIS, SEASONAL 05/11/2008  . SKIN RASH 05/11/2008  . HEADACHE 05/11/2008    Past Surgical History:  Procedure Laterality Date  . LAPAROSCOPIC APPENDECTOMY N/A 08/12/2015   Procedure: APPENDECTOMY LAPAROSCOPIC;  Surgeon: Violeta Gelinas, MD;  Location: [facility] OR;  Service: General;  Laterality: N/A;        Home Medications    Prior to Admission medications   Medication Sig Start Date End Date Taking? Authorizing Provider  benzonatate (TESSALON) 100 MG capsule Take 1 capsule (100 mg total) by mouth every 8 (eight) hours. Patient not taking: Reported on 08/28/2017 11/22/16   Belinda Fisher, PA-C  cetirizine-pseudoephedrine (ZYRTEC-D) 5-120 MG tablet Take 1 tablet by mouth daily. Patient not taking: Reported on 08/28/2017 11/22/16   Belinda Fisher, PA-C    fluticasone [facility]) 50 MCG/ACT nasal spray Place 2 sprays into both nostrils daily. Patient not taking: Reported on 08/28/2017 11/22/16   Belinda Fisher, PA-C  lidocaine (XYLOCAINE) 2 % solution Use as directed 15 mLs in the mouth or throat as needed for mouth pain. Swish and spit, do not swallow. Patient not taking: Reported on 08/28/2017 11/22/16   Belinda Fisher, PA-C  naproxen (NAPROSYN) 500 MG tablet Take 1 tablet (500 mg total) by mouth every 12 (twelve) hours as needed for mild pain or moderate pain. 08/28/17   Antony Madura, PA-C  oxyCODONE (OXY IR/ROXICODONE) 5 MG immediate release tablet Take 1-2 tablets (5-10 mg total) by mouth every 4 (four) hours as needed for moderate pain. Patient not taking: Reported on 08/28/2017 08/14/15   Abigail Miyamoto, MD    Family History No family history on file.  Social History Social History   Tobacco Use  . Smoking status: Never Smoker  . Smokeless tobacco: Never Used  Substance Use Topics  . Alcohol use: No  . Drug use: No     Allergies   Other and Propylene carbonate   Review of Systems Review of Systems Ten systems reviewed and are negative for acute change, except as noted in the HPI.    Physical Exam Updated Vital Signs BP (!) 126/59   Pulse 67   Temp 98 F (36.7 C) (Oral)   Resp (!) 26   Ht 5\' 3"  (1.6 m)   Wt 83.9 kg   SpO2  99%   BMI 32.77 kg/m   Physical Exam  Constitutional: He is oriented to person, place, and time. He appears well-developed and well-nourished. No distress.  Nontoxic appearing and in NAD  HENT:  Head: Normocephalic and atraumatic.  Eyes: Conjunctivae and EOM are normal. No scleral icterus.  Neck: Normal range of motion.  Cardiovascular: Normal rate, regular rhythm and intact distal pulses.  Pulmonary/Chest: Effort normal. No stridor. No respiratory distress. He has no wheezes. He exhibits no tenderness.  Respirations even and unlabored  Abdominal:  No abdominal TTP. No distension or peritoneal signs.   Musculoskeletal: Normal range of motion.  Neurological: He is alert and oriented to person, place, and time. He exhibits normal muscle tone. Coordination normal.  GCS 15. Moving all extremities.  Skin: Skin is warm and dry. No rash noted. He is not diaphoretic. No erythema. No pallor.  Psychiatric: He has a normal mood and affect. His behavior is normal.  Nursing note and vitals reviewed.    ED Treatments / Results  Labs (all labs ordered are listed, but only abnormal results are displayed) Labs Reviewed  BASIC METABOLIC PANEL - Abnormal; Notable for the following components:      Result Value   Creatinine, Ser 1.29 (*)    All other components within normal limits  CBC  I-STAT TROPONIN, ED    EKG EKG Interpretation  Date/Time:  Monday August 27 2017 23:06:51 EDT Ventricular Rate:  73 PR Interval:  122 QRS Duration: 92 QT Interval:  358 QTC Calculation: 394 R Axis:   39 Text Interpretation:  Normal sinus rhythm with sinus arrhythmia Nonspecific T wave abnormality Abnormal ECG No old tracing to compare Confirmed by Dione Booze ([id number]) on 08/28/2017 2:23:55 AM   Radiology Dg Chest 2 View  Result Date: 08/27/2017 CLINICAL DATA:  Upper back pain, chest pain EXAM: CHEST - 2 VIEW COMPARISON:  02/02/2013 FINDINGS: Heart and mediastinal contours are within normal limits. No focal opacities or effusions. No acute bony abnormality. IMPRESSION: No active cardiopulmonary disease. Electronically Signed   By: Charlett Nose M.D.   On: 08/27/2017 23:19    Procedures Procedures (including critical care time)  Medications Ordered in ED Medications  oxyCODONE-acetaminophen (PERCOCET/ROXICET) 5-325 MG per tablet 2 tablet (2 tablets Oral Not Given 08/28/17 0402)  gi cocktail (Maalox,Lidocaine,Donnatal) (30 mLs Oral Given 08/28/17 0248)     Initial Impression / Assessment and Plan / ED Course  I have reviewed the triage vital signs and the nursing notes.  Pertinent labs & imaging results  that were available during my care of the patient were reviewed by me and considered in my medical decision making (see chart for details).     Patient presents to the emergency department for evaluation of chest pain.  Low suspicion for emergent cardiac etiology given reassuring workup today.  EKG is nonischemic and troponin negative.  Patient has a heart score of 0 consistent with low risk of acute coronary event.  Chest x-ray without evidence of mediastinal widening to suggest dissection.  No pneumothorax, pneumonia, pleural effusion.  Pulmonary embolus further considered; however, patient without tachycardia, tachypnea, dyspnea, hypoxia.  Patient is PERC negative.  No improvement with GI cocktail.  Suspect MSK etiology given worsening symptoms with movement of chest wall.  Will d/c on Naproxen with Cardiology referral.  Return precautions discussed and provided. Patient discharged in stable condition with no unaddressed concerns.   Vitals:   08/28/17 0200 08/28/17 0215 08/28/17 0230 08/28/17 0245  BP: 128/66 123/76 (!) 147/75 Marland Kitchen)  126/59  Pulse: 65 (!) 55 68 67  Resp: (!) 26 (!) 24 (!) 23 (!) 26  Temp:      TempSrc:      SpO2: 99% 97% 99% 99%  Weight:      Height:        Final Clinical Impressions(s) / ED Diagnoses   Final diagnoses:  Atypical chest pain    ED Discharge Orders         Ordered    naproxen (NAPROSYN) 500 MG tablet  Every 12 hours PRN     08/28/17 0343           Antony MaduraHumes, Liv Rallis, PA-C 08/28/17 [id number]    Dione BoozeGlick, David, MD 08/28/17 [phone]

## 2017-08-28 NOTE — Discharge Instructions (Signed)
Your work up in the emergency department was reassuring and did not reveal a concerning cause of your symptoms today. Avoid strenuous activity and heavy lifting.  We recommend consistent use of naproxen for management of pain.  We recommend follow-up with a primary care doctor to ensure resolution of symptoms.  Return to the ED for any new or concerning symptoms.

## 2018-10-24 IMAGING — DX DG CHEST 2V
2 series · 2 of 2 positions shown · non-contrast
Comparison: 02/02/2013

CLINICAL DATA: Upper back pain, chest pain

EXAM:
CHEST - 2 VIEW

[chest pa]
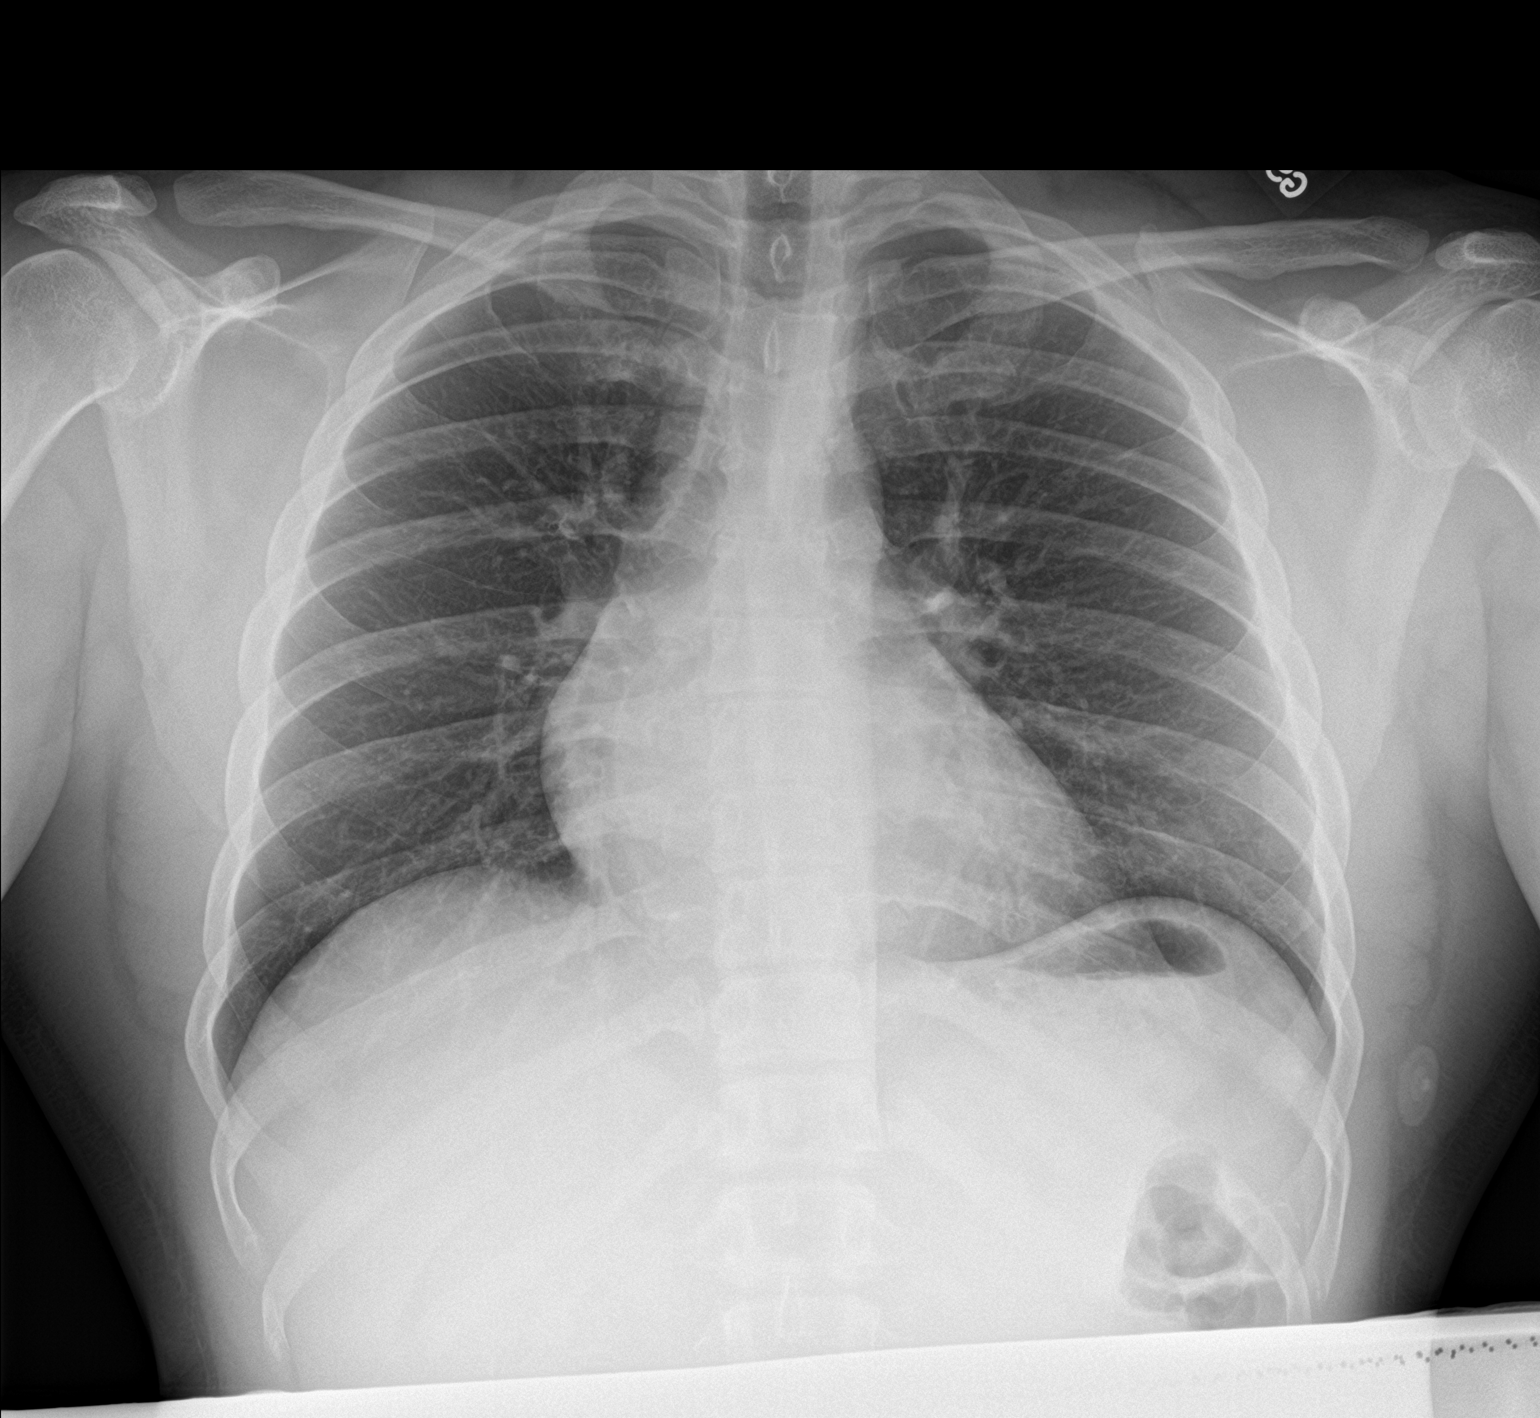

[chest lat]
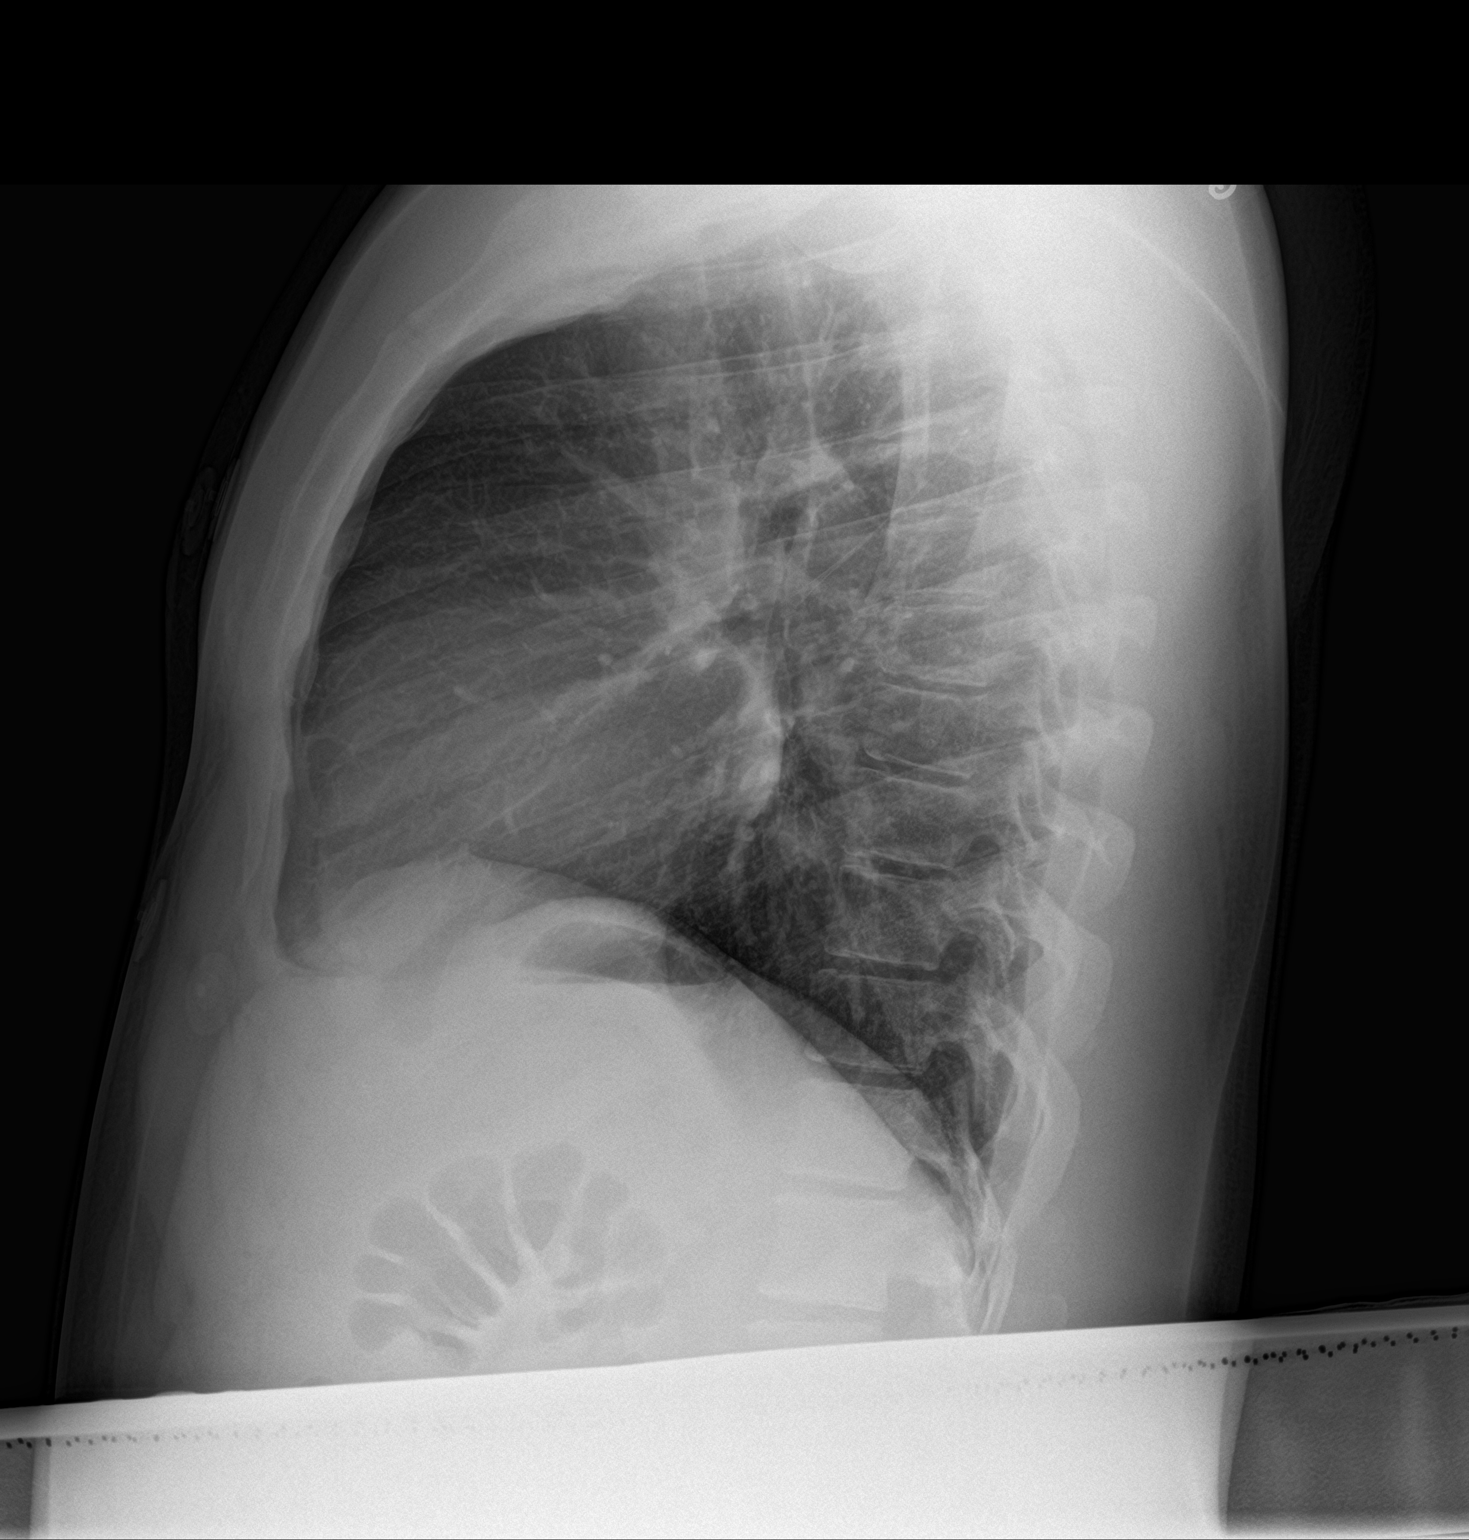

[2 of 2 positions shown; findings below may reference images not displayed]

FINDINGS: Heart and mediastinal contours are within normal limits. No focal
opacities or effusions. No acute bony abnormality.
IMPRESSION: No active cardiopulmonary disease.

## 2019-09-05 ENCOUNTER — Other Ambulatory Visit: Payer: BLUE CROSS/BLUE SHIELD

## 2022-03-30 ENCOUNTER — Other Ambulatory Visit (HOSPITAL_COMMUNITY): Payer: Self-pay
